# Patient Record
Sex: Male | Born: 1945 | Race: White | Hispanic: No | Smoking: Current every day smoker
Health system: Southern US, Community
[De-identification: ages and names within clinical notes are randomized; demographics above are authoritative.]

## PROBLEM LIST (undated history)

## (undated) DIAGNOSIS — E7800 Pure hypercholesterolemia, unspecified: Secondary | ICD-10-CM

## (undated) DIAGNOSIS — E271 Primary adrenocortical insufficiency: Secondary | ICD-10-CM

## (undated) DIAGNOSIS — I219 Acute myocardial infarction, unspecified: Secondary | ICD-10-CM

## (undated) DIAGNOSIS — I10 Essential (primary) hypertension: Secondary | ICD-10-CM

## (undated) DIAGNOSIS — K219 Gastro-esophageal reflux disease without esophagitis: Secondary | ICD-10-CM

## (undated) DIAGNOSIS — I2510 Atherosclerotic heart disease of native coronary artery without angina pectoris: Secondary | ICD-10-CM

## (undated) DIAGNOSIS — M549 Dorsalgia, unspecified: Secondary | ICD-10-CM

## (undated) DIAGNOSIS — R0600 Dyspnea, unspecified: Secondary | ICD-10-CM

## (undated) DIAGNOSIS — G8929 Other chronic pain: Secondary | ICD-10-CM

## (undated) HISTORY — PX: APPENDECTOMY: SHX54

## (undated) HISTORY — PX: BACK SURGERY: SHX140

## (undated) HISTORY — PX: CHOLECYSTECTOMY: SHX55

---

## 1999-04-19 DIAGNOSIS — I219 Acute myocardial infarction, unspecified: Secondary | ICD-10-CM

## 1999-04-19 HISTORY — DX: Acute myocardial infarction, unspecified: I21.9

## 1999-04-19 HISTORY — PX: CORONARY ANGIOPLASTY WITH STENT PLACEMENT: SHX49

## 2001-01-20 ENCOUNTER — Encounter: Payer: Self-pay | Admitting: Cardiology

## 2001-01-20 ENCOUNTER — Inpatient Hospital Stay (HOSPITAL_COMMUNITY): Admission: EM | Admit: 2001-01-20 | Discharge: 2001-01-21 | Payer: Self-pay | Admitting: Cardiology

## 2001-01-21 ENCOUNTER — Encounter: Payer: Self-pay | Admitting: Cardiology

## 2003-03-18 ENCOUNTER — Ambulatory Visit (HOSPITAL_COMMUNITY): Admission: RE | Admit: 2003-03-18 | Discharge: 2003-03-18 | Payer: Self-pay | Admitting: *Deleted

## 2003-04-19 HISTORY — PX: CORONARY ARTERY BYPASS GRAFT: SHX141

## 2003-04-22 ENCOUNTER — Inpatient Hospital Stay (HOSPITAL_COMMUNITY): Admission: RE | Admit: 2003-04-22 | Discharge: 2003-05-01 | Payer: Self-pay | Admitting: Cardiothoracic Surgery

## 2004-08-02 ENCOUNTER — Ambulatory Visit: Payer: Self-pay | Admitting: Cardiovascular Disease

## 2004-08-12 ENCOUNTER — Ambulatory Visit: Payer: Self-pay | Admitting: Cardiovascular Disease

## 2005-03-09 ENCOUNTER — Ambulatory Visit: Payer: Self-pay | Admitting: Cardiovascular Disease

## 2005-11-17 ENCOUNTER — Ambulatory Visit: Payer: Self-pay | Admitting: Cardiovascular Disease

## 2007-03-22 ENCOUNTER — Ambulatory Visit: Payer: Self-pay | Admitting: Cardiovascular Disease

## 2007-03-22 LAB — CONVERTED CEMR LAB
Bilirubin, Direct: 0.1 mg/dL (ref 0.0–0.3)
Cholesterol: 182 mg/dL (ref 0–200)
Cortisol, Plasma: 17.3 ug/dL
Eosinophils Absolute: 0 10*3/uL (ref 0.0–0.6)
Eosinophils Relative: 0.1 % (ref 0.0–5.0)
Free T4: 0.5 ng/dL — ABNORMAL LOW (ref 0.6–1.6)
GFR calc Af Amer: 88 mL/min
GFR calc non Af Amer: 73 mL/min
Glucose, Bld: 93 mg/dL (ref 70–99)
HCT: 37 % — ABNORMAL LOW (ref 39.0–52.0)
HDL: 33.7 mg/dL — ABNORMAL LOW (ref >39.0)
Lymphocytes Relative: 31.3 % (ref 12.0–46.0)
MCHC: 35.2 g/dL (ref 30.0–36.0)
MCV: 91.6 fL (ref 78.0–100.0)
Neutro Abs: 3.3 10*3/uL (ref 1.4–7.7)
Neutrophils Relative %: 62.4 % (ref 43.0–77.0)
PSA: 0.1 ng/mL (ref 0.10–4.00)
Potassium: 3.7 meq/L (ref 3.5–5.1)
Sodium: 139 meq/L (ref 135–145)
Total CHOL/HDL Ratio: 5.4
WBC: 5.2 10*3/uL (ref 4.5–10.5)
aPTT: 24.6 s (ref 21.7–29.8)

## 2007-05-15 ENCOUNTER — Ambulatory Visit: Payer: Self-pay | Admitting: Cardiovascular Disease

## 2007-05-15 LAB — CONVERTED CEMR LAB
ALT: 24 units/L (ref 0–53)
Bilirubin, Direct: 0.1 mg/dL (ref 0.0–0.3)
CO2: 29 meq/L (ref 19–32)
Calcium: 9.3 mg/dL (ref 8.4–10.5)
GFR calc Af Amer: 80 mL/min
Glucose, Bld: 84 mg/dL (ref 70–99)
Total Protein: 7.1 g/dL (ref 6.0–8.3)

## 2008-03-18 ENCOUNTER — Emergency Department (HOSPITAL_COMMUNITY): Admission: EM | Admit: 2008-03-18 | Discharge: 2008-03-18 | Payer: Self-pay | Admitting: Emergency Medicine

## 2008-03-28 ENCOUNTER — Ambulatory Visit (HOSPITAL_COMMUNITY): Admission: RE | Admit: 2008-03-28 | Discharge: 2008-03-28 | Payer: Self-pay | Admitting: Pulmonary Disease

## 2008-04-23 ENCOUNTER — Encounter: Admission: RE | Admit: 2008-04-23 | Discharge: 2008-04-23 | Payer: Self-pay | Admitting: Neurosurgery

## 2008-05-06 ENCOUNTER — Ambulatory Visit: Payer: Self-pay | Admitting: Cardiovascular Disease

## 2008-05-07 ENCOUNTER — Encounter: Admission: RE | Admit: 2008-05-07 | Discharge: 2008-05-07 | Payer: Self-pay | Admitting: Neurosurgery

## 2008-07-31 ENCOUNTER — Telehealth: Payer: Self-pay | Admitting: Cardiovascular Disease

## 2008-08-02 DIAGNOSIS — R0602 Shortness of breath: Secondary | ICD-10-CM | POA: Insufficient documentation

## 2008-08-02 DIAGNOSIS — K219 Gastro-esophageal reflux disease without esophagitis: Secondary | ICD-10-CM | POA: Insufficient documentation

## 2008-08-02 DIAGNOSIS — I10 Essential (primary) hypertension: Secondary | ICD-10-CM | POA: Insufficient documentation

## 2008-08-02 DIAGNOSIS — I498 Other specified cardiac arrhythmias: Secondary | ICD-10-CM | POA: Insufficient documentation

## 2008-08-02 DIAGNOSIS — I2510 Atherosclerotic heart disease of native coronary artery without angina pectoris: Secondary | ICD-10-CM | POA: Insufficient documentation

## 2008-08-02 DIAGNOSIS — E7800 Pure hypercholesterolemia, unspecified: Secondary | ICD-10-CM | POA: Insufficient documentation

## 2008-08-02 DIAGNOSIS — M549 Dorsalgia, unspecified: Secondary | ICD-10-CM | POA: Insufficient documentation

## 2008-08-05 ENCOUNTER — Encounter: Payer: Self-pay | Admitting: Cardiovascular Disease

## 2008-08-05 ENCOUNTER — Ambulatory Visit: Payer: Self-pay | Admitting: Cardiovascular Disease

## 2010-08-31 NOTE — Assessment & Plan Note (Signed)
[facility]                            CARDIOLOGY OFFICE NOTE   Victor Nelson, Victor Nelson                       MRN:          [id number]  DATE:05/06/2008                            DOB:          06-09-1945    Victor Nelson is here in today for a followup.  He has Addison disease.  He has  had a multitude of noncardiac issues without recently.   He has had significant back pain and saw Dr. Jeral Fruit.  He got some  injections.  He got a bit disoriented and confused on Percocet and  Xanax.   Dr. Juanetta Gosling sometimes takes care of his pain medicines.   He has also had another bout of diarrhea last year.  He had Norovirus  and had significant rhabdomyolysis with diarrhea, given his Addison  disease.  This is a significant problem.   He has not had any significant fevers or lightheadedness.   From a cardiac perspective, he has coronary artery disease with a  previous CABG.  He is not having any significant chest pain, PND, or  orthopnea.  There has been no palpitations or syncope.   His last Myoview study was a long time ago.  I believe back in 2004 or  2005.  He is otherwise doing well.   REVIEW OF SYSTEMS:  Otherwise negative.   MEDICATIONS:  1. Allegra.  2. Florinef 0.15 mg a day.  3. Aspirin a day.  4. Hydrocortisone 20 b.i.d.  5. Lopressor 25 a day.  6. Some sort of new stomach medicine.   PHYSICAL EXAMINATION:  VITAL SIGNS:  His weight is 254, blood pressure  is 140/80, pulse 53 and regular, respiratory rate 14, and afebrile.  HEENT:  Unremarkable.  NECK:  Carotids are normal without bruit.  No lymphadenopathy,  thyromegaly, or JVP elevation.  LUNGS:  Clear.  Good diaphragmatic motion.  No wheezing.  HEART:  S1 and S2.  Normal heart sounds.  PMI normal.  He has an  inverted nipple on the left.  ABDOMEN:  Benign.  Bowel sounds positive.  No AAA.  No tenderness.  No  bruit.  No hepatosplenomegaly.  No hepatojugular reflux.  EXTREMITIES:  Distal pulses are  intact.  No edema.  NEUROLOGIC:  Nonfocal.  SKIN:  Warm and dry.  MUSCULOSKELETAL:  No muscular weakness.  Status post radial harvest site  in the left arm.   IMPRESSION:  1. Coronary artery disease, previous coronary artery bypass graft.      Continue aspirin and low-dose beta-blocker.  2. Relative bradycardia.  Particularly given his Addison disease, I      would like his resting heart rate to be in the 60s.  We will cut      his Lopressor back to 12.5 mg a day.  3. Addison disease.  Continue Florinef and hydrocortisone replacement.      I believe his baseline cortisol level was recently checked by Dr.      Juanetta Gosling.  Consider increasing dosages during illnesses.  4. Back pain.  Follow up with Dr. Jeral Fruit.  Try to find non-sedating  anti-inflammatories such as meloxicam or Toradol.  5. Hypercholesterolemia.  The patient is currently not on statin      therapy.  He had rhabdomyolysis last year, although I do not think      he was primarily related to the statin.  I will leave it up to Dr.      Juanetta Gosling to see if he wants to reinstitute cholesterol therapy for      the patient.   The patient's EKG today showed sinus bradycardia at the rate of 53 with  low voltage and poor R-wave progression,     Peter C. Eden Emms, MD, [facility]  Electronically Signed    PCN/MedQ  DD: 05/06/2008  DT: 05/06/2008  Job #: [id number]

## 2010-08-31 NOTE — Assessment & Plan Note (Signed)
[facility]                            CARDIOLOGY OFFICE NOTE   OLDEN, KLAUER                       MRN:          [id number]  DATE:03/22/2007                            DOB:          1945/10/23    Mr. Victor Nelson returns today for followup.  He is doing well.   He has denied any significant chest pain.   Unfortunately, he has gained quite a bit of weight.  We had a bit of a  discussion about this.  He understands how bad it is for his health.  I  think it is also detrimental to his Addison's disease.  He needs to  follow this up with Dr. Juanetta Gosling.   Apparently, he needs some sort of a plant physical in regards to his  insurance rate.  He works for a [employer].  I told him I would be  happy to give him a copy of this note and do his blood work today.   In talking to him, he has not been very active.  He seems to worry a lot  about his bills and his job.  Sometimes people get on his nerves.  This,  coupled with somewhat of a poor diet, has caused him to gain quite a bit  of weight.  As far as I can tell, it has been at least 15 pounds since I  last saw him.   He does get occasional exertional dyspnea.  There has been no cough,  fever or sputum production.   REVIEW OF SYSTEMS:  Remarkable for improvement in his lower extremity  edema since I last saw him.  He did take my recommendation to have his  lower extremity varicosities sclerosed by Dr. Sondra Come.  Review of systems  otherwise negative.   CURRENT MEDICATIONS INCLUDE:  1. Ranitidine 150 b.i.d.  2. Lipitor 40 at night.  3. Allegra 180 a day.  4. Florinef 0.1 mg a day.  5. An aspirin a day.  6. Hydrocortisone 20 b.i.d.  7. Lopressor 25 a day.   He is taking a baby aspirin a day.   He denies any allergies.   EXAM:  His exam is remarkable for a jovial, but overweight, middle-aged  white male, in no distress.  Weight is 270, blood pressure 130/70, pulse 70 and regular.  There is an  occasional PVC on his EKG.  Respiratory rate 14, afebrile.  HEENT:  Unremarkable.  Carotids are without bruit.  No lymphadenopathy,  thyromegaly, or JVP elevation.  LUNGS:  Clear, good diaphragmatic motion, no wheezing.  His eyes are somewhat prominent.  There is an S1, S2, normal heart sounds.  PMI is normal.  He is status  post sternotomy.  ABDOMEN:  Protuberant.  Bowel sounds are positive.  No tenderness, no  bruit, no AAA, no hepatosplenomegaly or hepatojugular reflux.  Distal pulses are intact.  His varicosities are now gone.  He has tiny  spider vessels in each leg.  No edema.  NEUROLOGIC:  Nonfocal.  No muscular weakness.  SKIN:  Warm and dry.   IMPRESSION:  1. Stable, status  post CABG.  Continue aspirin and beta blocker.      Followup Myoview in two years.  2. Addison's disease.  Follow up with Dr. Juanetta Gosling.  If his weight      continues to increase, he may very well need a higher dose of      hydrocortisone.  He has not had any postural symptoms and I think      his Florinef dose is fine.  We will check his sodium and potassium      today, as well as a random cortisol.  3. Hypertension.  Currently stable.  Continue beta blocker.  He is not      on too much Florinef, as there is no systolic hypertension.  4. Hypercholesterolemia in the setting of previous CABG.  Continue      Lipitor 40 a day.  Liver and lipid profile to be done today.  5. 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      Continue activity levels and try to lose weight.  Continue      Ranitidine 150 b.i.d.  Overall, Victor Nelson is doing well outside of his      weight-gain.  He will follow with Dr. Juanetta Gosling for his Addison's      disease.  We will check his blood work, including a PSA, TSH, T4,      cortisol level, comprehensive metabolic profile, PT, PTT and lipid      panel today.  His EKG today showed sinus rhythm with nonspecific      STT-wave changes and occasional PVC.  His QT interval was  450.     Noralyn Pick. Eden Emms, MD, [facility]  Electronically Signed    PCN/MedQ  DD: 03/22/2007  DT: 03/22/2007  Job #: [id number]

## 2010-08-31 NOTE — Assessment & Plan Note (Signed)
[facility]                            CARDIOLOGY OFFICE NOTE   Victor, Scicchitano MALEAK Nelson                       MRN:          [id number]  DATE:05/15/2007                            DOB:          [birth year]    Victor Nelson returns today for follow-up.   The patient is status post CABG in 2005.   He has good LV function.   Unfortunately, he also has had Addison disease since about the age of  65.  He was recently hospitalized.  He had Norovirus and after only 3  days of nausea and vomiting presented with what sounds like  rhabdomyolysis with severe dehydration.  I suspect his hydrocortisone  taken orally was not being absorbed.   His Lipitor was stopped at the time.  I do not think it has anything to  do with his elevated CPK.  I think he had more of an addisonian crisis  with rhabdomyolysis.  He has been on Lipitor for awhile.  He had normal  LFTs twice last year, the most recent of which was December 2008.  He  has never had myalgias on the Lipitor.   I told him we would check his LFTs, CPK and BMET today.  If his indices  are back down to normal, we will resume his Lipitor.   Otherwise, he is doing well.   REVIEW OF SYSTEMS:  Remarkable for no recurrent nausea, vomiting or  diarrhea and no fevers.  He is back to his normal self.  He has actually  lost about 7 pounds.   MEDICATIONS:  1. Ranitidine 150 b.i.d.  2. Allegra 180 mg a day.  3. Florinef 0.1 mg a day.  4. An aspirin a day.  5. Hydrocortisone 20 mg b.i.d.  6. Lopressor 25 mg a day.  7. Probably Lipitor 40 mg a day to be resumed.   EXAM:  Remarkable for a healthy-appearing middle-aged white male in no  distress currently.  Weight is 263, down from 270.  Blood pressure is 116/71.  He is not  postural.  Pulse 57.  Afebrile.  HEENT:  Unremarkable.  Carotids were normal without bruit.  No lymphadenopathy, thyromegaly or  JVP elevation.  LUNGS:  Clear.  Good diaphragmatic motion.  No  wheezing.  S1, S2 with normal heart sounds, status post sternotomy.  PMI normal.  ABDOMEN:  Benign.  Bowel sounds positive.  No AAA.  No  hepatosplenomegaly or hepatojugular reflux.  No tenderness, no bruit.  Distal pulses are intact, no edema.  NEUROLOGIC:  Nonfocal.  SKIN:  Warm and dry.  No muscular weakness.   IMPRESSION:  1. Previous coronary artery bypass graft.  Continue aspirin and beta      blocker.  2. Hypercholesterolemia in the setting of coronary disease.  Checking      lab work to make sure rhabdomyolysis has resolved.  Restart Lipitor      if so.  3. Recent hospitalization for dehydration, rhabdomyolysis and      Norovirus.  His gastrointestinal tract seems to be back in order.      He is taking  p.o. well.  I had a long discussion with Victor Nelson and      told him that given his Addison disease, he really needs to see a      doctor if he goes more than 2 days without being able to take oral      intake or absorbing his hydrocortisone.  He follows up with Dr.      Juanetta Gosling for adjustments of his Florinef and hydrocortisone.  4. Addison disease, to be followed by Dr. Juanetta Gosling.  5. History of reflux.  Continue ranitidine 150 mg b.i.d.   I will see Victor Nelson back in 6 months and we will review his lab work when  it is done.     Noralyn Pick. Eden Emms, MD, [facility]  Electronically Signed    PCN/MedQ  DD: 05/15/2007  DT: 05/16/2007  Job #: [id number]

## 2010-09-03 NOTE — Discharge Summary (Signed)
NAME:  Victor Nelson, DEGRAFFENREID NO.:  [id number]   MEDICAL RECORD NO.:  [id number]                   PATIENT TYPE:  INP   LOCATION:  2025                                 FACILITY:  [facility]   PHYSICIAN:  Gwenith Daily. Tyrone Sage, M.D.            DATE OF BIRTH:  [birth year]   DATE OF ADMISSION:  04/22/2003  DATE OF DISCHARGE:  05/01/2003                                 DISCHARGE SUMMARY   ADMISSION DIAGNOSIS:  Critical coronary artery disease.   DISCHARGE/SECONDARY DIAGNOSES:  1. Critical coronary artery disease, status post stent in 2001 with history     of myocardial infarction in 2001.  Status post coronary artery bypass     grafting.  2. Primary adrenal insufficiency.  Diagnosed with Addison's disease at age     70.  3. Hyperlipidemia.  4. Postoperative hyponatremia, hypokalemia and orthostatic hypotension,     stable at discharge.  5. Postoperative atrial fibrillation, resolved on amiodarone and Lopressor.  6. Questionable right hand cellulitis versus phlebitis, status post bedside     incision and drainage.  Treated with oral Keflex.  Final cultures showed     no organisms or yeast or fungal elements.   CONSULTS:  Dr. Fayrene Fearing L. Deterding.   CARDIOLOGIST:  Dr. Charlton Haws.   PRIMARY CARE PHYSICIAN:  Dr. Ramon Dredge L. Hawkins.   PROCEDURES:  On April 22, 2003, Victor Nelson underwent coronary artery bypass  grafting x3 with the left internal mammary artery to the left anterior  descending coronary artery, left radial artery to the posterior descending  coronary artery and reversed saphenous vein graft to the distal circumflex.  He also underwent right thigh endovein and left radial artery harvesting.  Surgeon was Dr. Ramon Dredge B. Gerhardt.   BRIEF HISTORY:  Victor Nelson is a 65 year old male who was referred to Dr.  Ramon Dredge B. Gerhardt by Dr. Charlton Haws after experiencing a 2- to 63-month  history of increasing fatigue/exhaustion, increasing shortness of breath  with  exertion with vague chest discomfort.  Initially, this began once or  twice a month but had been increasing in frequency and came on quickly and  with less exertion more recently.  The episodes were relieved with rest or  with 1 nitroglycerin tablet.  The patient also has a history of myocardial  infarction in the past and had a stent placed in his LAD at [facility]  in 2001.  Because of his increasing symptoms, Dr. Charlton Haws performed a  Cardiolite stress test on March 05, 2004 which showed a moderate-sized  apical and inferoapical wall infarction but with significant interior and  anterior wall septal wall ischemia from the apex to the base with overall  ejection fraction of 49%.  Because of increasing symptoms and his positive  stress test, it was felt the patient would benefit from a repeat cardiac  catheterization.  The patient did undergo cardiac catheterization on  March 18, 2003 by Dr. Emilie Rutter. Pulsipher.  Findings demonstrated  reocclusion of his LAD with collateral filling from his right coronary  artery which had significant mid-vessel disease.  In addition, he had distal  disease in his circumflex.  Based on these findings, it was recommended that  he should undergo cardiac revascularization.  Victor Nelson initially saw Dr.  Tyrone Sage at the CVTS office on April 10, 2003.  After reviewing the  patient's films and discussing his symptoms and the findings of a Cardiolite  stress test, Dr. Tyrone Sage did agree with proceeding with coronary artery  bypass grafting.  He did discuss the risks, benefits and alternatives with  the patient and the patient did agree to proceed.  His surgery was  tentatively scheduled for April 22, 2003 at [facility].   HOSPITAL COURSE:  On April 22, 2003, Victor Nelson was electively admitted to  [facility] and did undergo coronary artery bypass grafting as  discussed above.  The patient tolerated the procedure well and was   transferred from the operating room to the surgical intensive care unit in  stable condition.  Later that evening, he was afebrile and hemodynamically  stable.  His chest tube output was minimal.  His postoperative labs were  also stable, however, his blood glucose was slightly elevated at 183 and he  did require sliding-scale insulin coverage.  This was felt to be secondary  to steroids for Addison's disease.   On postoperative day 1, [name] remained stable.  His blood pressure was  110/50, off dopamine.  He was started on Imdur secondary to his radial  artery harvesting.  By this time, he had been extubated and was  neurologically intact.  He was saturating 95% on room air.  His urine output  was also stable.   On postoperative day 2, [name] continued to progress.  He initially was  in sinus rhythm, however, later that evening, he did develop rapid atrial  fibrillation with a rate up to 200.  He was started on an amiodarone drip  and by the following morning, he had converted to sinus rhythm and was  changed over to oral amiodarone.  However, by postoperative day 4, he  developed rapid atrial fibrillation again and was restarted on IV  amiodarone.  He was also started on Lovenox secondary to his recurrent  atrial fibrillation.  By postoperative day 5, he had converted back to sinus  rhythm.  At this time, however, he was also having orthostatic blood  pressures and his labs showed a decreased sodium at 128 and relatively low  potassium at 3.9.  Based on these findings,  Dr. Fayrene Fearing L. Deterding was  consulted, who felt these findings were most consistent with relative  adrenal insufficiency, primarily glucocorticoid secondary to stress and some  volume depletion.  He did recommend increasing his hydrocortisone and adding  IV fluids.  His recommendations were followed and prior to discharge, Mr. Dicenzo sodium, potassium and blood pressures all improved.  On  postoperative day 5,  the patient was also noted to have right hand swelling  with erythema; this was felt secondary to phlebitis from his IV site.  His  erythema and swelling eventually progressed and did require Keflex and a  bedside incision and drainage.  Routine cultures were negative, as discussed  above.   Over the next several days, Mr. Hardenbrook continued to progress.  He did have  another short run of atrial fibrillation and for a  short time, was started  on Coumadin, however, he was in sinus rhythm for the last few days of his  hospitalization.  Due to the fact that the patient remained in sinus rhythm  at discharge, Dr. Tyrone Sage felt that he did not need to send the patient  home on Coumadin.  He was continued on oral amiodarone.   By postoperative day 9, Dr. Ramon Dredge B. Tyrone Sage did feel that Mr. Boehning was  stable for discharge.  The patient had remained afebrile throughout his  hospitalization.  He was currently in sinus rhythm.  His blood pressure was  stable on Lopressor and Imdur.  He was saturating 95% on room air.  During  his hospitalization, he did have intermittent nausea which may have been  possibly related to his amiodarone, however, this did resolve and prior to  his discharge, his bowel and bladder function were working appropriately.  His abdominal exam was benign.  His extremities showed no edema, other than  his right hand, which was discussed above.  This did show improvement on  Keflex.  His sternal and lower extremity incisions were clean, dry and  intact without signs of infection.  He was ambulating independently with a  steady gait.  Day before his discharge, his chest tubes had been removed  without complication.  His most recently chest x-ray on April 28, 2003  showed decreasing bibasilar atelectasis with persistent bilateral small  pleural effusions.  His most recent labs on May 01, 2003 showed a sodium  of 133, potassium 3.1, which was supplemented with oral potassium  prior to  his discharge home.  His blood glucose was 117, his BUN 8, his creatinine  1.1, his albumin 3.3, calcium 8.8 and phosphorus 3.9.  His urine osmolality  was normal at 437.  His most recent CBC on April 29, 2003 showed a normal  white blood count at 7.4, hemoglobin 9.7, hematocrit 27.5 and platelet count  330,000.   Due to his steady progression, Dr. Tyrone Sage did feel Mr. Shepherd would be  ready for discharge on May 01, 2003.   DISCHARGE INSTRUCTIONS:   DISCHARGE MEDICATIONS:  1. Coated aspirin 81 mg 1 p.o. daily.  2. Imdur 30 mg 1 p.o. daily.  3. Lopressor 25 mg t.i.d.  4. Amiodarone 400 mg twice a day x5 days, then decreased to daily.  5. Keflex 500 mg t.i.d. x6 days.  6. Zantac 150 mg 1 p.o. b.i.d.  7. Allegra 180 mg 1 p.o. daily.  8. Hydrocortisone 25 mg b.i.d. x10 days, then 20 mg twice a day.  9. Lipitor 40 mg 1 p.o. daily.  10.      Florinef 0.1 mg daily. 11.      Tylox 1-2 tablets p.o. q.4 h. p.r.n.   ACTIVITY:  Instructed to avoid driving, heavy lifting or strenuous activity.   DIET:  Needs to follow a low-fat, low-sodium diet.   WOUND CARE:  He may shower daily and clean his incisions gently with soap  and water.  He is to notify the CVTS office if he develops fever or redness  and drainage from his incision sites.   FOLLOWUP:  1. He is to follow up with Dr. Tyrone Sage on Thursday, May 22, 2003, at 11     a.m.  He is instructed to bring his most recent chest x-ray with him to     this appointment.  2. He is to follow up with Dr. Charlton Haws in 2 weeks with a chest x-ray.  An appointment was scheduled for him to see the physician assistant on     May 15, 2003 at 10:30 a.m.      Jerold Coombe, P.A.                  Gwenith Daily Tyrone Sage, M.D.    AWZ/MEDQ  D:  07/02/2003  T:  07/05/2003  Job:  [id number]   cc:   Gwenith Daily. Tyrone Sage, M.D.  [address]  [place]  [place] [address]   Charlton Haws, M.D.   Edward L. Juanetta Gosling, M.D.  [address]  [place]  [place] [address]  Fax: [phone]

## 2010-09-03 NOTE — Consult Note (Signed)
NAME:  Victor Nelson, Victor Nelson NO.:  [id number]   MEDICAL RECORD NO.:  [id number]                   PATIENT TYPE:  INP   LOCATION:  2025                                 FACILITY:  [facility]   PHYSICIAN:  Dmarcus L. Deterding, M.D.            DATE OF BIRTH:  1945/07/31   DATE OF CONSULTATION:  04/28/2003  DATE OF DISCHARGE:                                   CONSULTATION   REFERRING PHYSICIAN:  Gwenith Daily. Tyrone Sage, M.D.   REASON FOR CONSULTATION:  1. Hyponatremia.  2. Hypokalemia.  3. Orthostatic hypotension.   HISTORY OF PRESENT ILLNESS:  This is a 65 year old gentleman who has a  history of coronary artery disease, status post stent in 2001, with a  history of an MI at that time.  He underwent a coronary artery bypass  grafting on April 22, 2003, and is now six days postoperative.  He had had  recurrent angina, which was reason he was evaluated.  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 also and Florinef 0.1 mg a day.  He has a  brother who has Addison's and also was diagnosed in his teens.  He has a  history of hyperlipidemia.  His hydrocortisone was increased for two days  postoperatively, but then it was cut back to his usual dose of 20 mg b.i.d.  His weight has decreased from 254 to 239 pounds.  Postoperatively, he did  fairly well until the last three days when his serum sodium had decreased  from 135 to 126.  Serum potassium has been normal to mildly decreased and  now down to 3.2.  Also, he has developed some orthostatic hypotension.  He  also has had atrial fibrillation in the last four or five days.   PAST MEDICAL HISTORY:  He quit smoking about eight years ago.  He has a  history of mildly increased lipids.   MEDICATIONS:  Above.   ALLERGIES:  No known allergies.  He does have hay fever.   FAMILY HISTORY:  Positive for a stroke in his father who died at about 32.  His  father was also paralyzed.  His mother died of liver disease in her 34s.  A brother died at age 26 with myocardial infarction.   PAST MEDICAL HISTORY:  As listed above.  He has also had back surgery.  He  also had a colonoscopy.   SOCIAL HISTORY:  He is married.  He is employed in the [employer] as a  Pensions consultant.   MEDICATIONS AT HOME:  1. Aspirin 325 mg a day.  2. Allegra 180 mg a day.  3. Darvocet 100 mg q.6h. p.r.n.  4. Zantac 150 mg b.i.d. p.r.n.  5. Hydrocortisone 20 mg b.i.d.  6. Atenolol 50 mg.  7. Florinef 0.1 mg.  8. Lipitor 40 mg a day.  REVIEW OF SYSTEMS:  HEENT:  Denies visual difficulties, headaches, and  hearing difficulties.  PULMONARY:  No cough, sputum production, shortness of  breath, and asthma.  He does have hay fever.  MUSCULOSKELETAL:  Other than  back pain, he denies problems.  GASTROINTESTINAL:  He has occasional  constipation.  No nausea or vomiting.  He has had some trouble swallowing  pills the last couple of days.  GENITOURINARY:  He has nocturia x 1-2.  He  denies edema.  He says that he has had varicose veins in the past.  NEUROLOGIC:  He denies problems other than the fact that he is anxious.   OBJECTIVE:  GENERAL APPEARANCE:  A middle-aged gentleman who appears his  stated age.  He is in no acute distress.  VITAL SIGNS:  The blood pressure is 118/66, temperature 98.6 degrees, and  heart rate going from 66-84.  CARDIOVASCULAR:  Irregularly irregular.  A grade 2/6 holosystolic murmur at  the apex.  The PMI is __________ fifth intercostal space.  There is 1+  edema.  Pulses are 2+ and 4+.  No bruits are noted.  LUNGS:  Some crackles in both bases.  Normal breath sounds.  Normal  resonance to percussion.  Normal expansion.  ABDOMEN:  Positive bowel sounds.  Soft.  The liver is down 2-3 cm.  SKIN:  A few seborrheic keratoses and some skin tags.  MUSCULOSKELETAL:  Unremarkable.  He has an incision in his left forearm  where they  took his radial artery, as well as his midline CABG incision and  mediastinal incisions.   LABORATORY DATA:  The TSH is 3.8.  Hemoglobin 9.8, white count 5400,  platelets 290,000.  Sodium 126, potassium 3.2, chloride 91, bicarbonate 26,  creatinine 0.9, BUN 7, glucose 120, magnesium 2.3.   ASSESSMENT:  1. Decreased sodium, decreased serum potassium, and orthostasis most     consistent with relative adrenal insufficiency, primarily glucocorticoid     secondary to stress and some volume depletion.  The decreased potassium     is secondary to post diuretic effect, as well as the Florinef.  The     mineralocorticoid deficiency usually causes hyperkalemia.  I do not think     he is truly mineralocorticoid deficient.  He needs increase in doses of     his glucocorticoid and a stable dose of mineralocorticoid in this     setting.  He is unable to excrete free water with his relative volume     depletion which stimulates ADH, especially if free water is retaining     excess in the setting of decreased corticoids.  2. Status post coronary artery bypass graft and coronary artery disease.  3. Atrial fibrillation.  Plan per CVTS and cardiology.  4. History of lumbar disk surgery.   PLAN:  1. Increase his hydrocortisone.  2. Check a urine sodium, creatinine, and osmolality.  3. IV fluids.                                               Laney L. Deterding, M.D.    JLD/MEDQ  D:  04/28/2003  T:  04/28/2003  Job:  [id number]

## 2010-09-03 NOTE — Cardiovascular Report (Signed)
NAME:  Victor Nelson, Victor Nelson NO.:  [id number]   MEDICAL RECORD NO.:  [id number]                   PATIENT TYPE:  OIB   LOCATION:  2858                                 FACILITY:  [facility]   PHYSICIAN:  Carole Binning, M.D. [facility]         DATE OF BIRTH:  Mar 11, 1946   DATE OF PROCEDURE:  03/18/2003  DATE OF DISCHARGE:                              CARDIAC CATHETERIZATION   PROCEDURE PERFORMED:  Left heart catheterization with coronary angiography  and left ventriculography.   INDICATIONS:  Mr. Bosler is a 65 year old male with a history of prior  percutaneous coronary intervention with stent placement in the LAD.  He  initially had an anterior wall myocardial infarction in January of 2001  following which he had a repeat angioplasty and stenting of the LAD.  He was  seen in the office by Charlton Haws, M.D. and was having symptoms of  exertional dyspnea and intermittent chest pain.  A stress Cardiolite scan  showed significant anterior and anteroseptal wall ischemia with moderate  sized apical and inferoapical wall infarction, ejection fraction 49%.  He  was thus referred for cardiac catheterization.   PROCEDURAL NOTE:  A 6-French sheath was placed in the right femoral artery.  Coronary angiography was performed with 6-French JL4 and JR4 catheters.  Left ventriculography was performed with an angled pigtail catheter.  Contrast was Omnipaque.  There were no complications.   RESULTS:  HEMODYNAMICS:  Left ventricular pressure 142/26.  Aortic pressure 142/74.  There is no  aortic valve gradient.   LEFT VENTRICULOGRAM:  There is mild akinesis of the inferior apical wall with moderate hypokinesis  of the anterior wall.  Ejection fraction estimated at 50%.  There is no  mitral regurgitation.   CORONARY ARTERIOGRAPHY:  (Codominant)  Left main is normal.   Left anterior descending artery is 100% occluded in the proximal vessel just  after the first septal  perforator.  Prior to the occlusion the LAD does give  rise to a small diagonal branch.  The mid and distal LAD fill via left to  left and left to right collaterals.  There is a stent in the mid LAD and the  collaterals fill retrograde into the very proximal margin of the stent.  However, there is an area of occlusion of the LAD between the proximal  vessel and the stent which appears to be approximately 20 mm in length.  The  LAD occlusion has a very abrupt cut off tapering into the septal perforator.  There is also a second diagonal branch which fills poorly via collaterals  which rises from within the occluded portion of the LAD.   Left circumflex is a codominant vessel.  It gives rise to a large obtuse  marginal branch, a large first posterolateral branch, and a normal sized  second posterolateral branch.  There is a 50% stenosis in the distal  circumflex prior to the first posterolateral branch  followed by two 50%  stenoses between the first and second posterolateral branches.  The first  posterolateral branch itself also has an area of ectasia in the proximal  segment proximal to a bifurcation.   Right coronary artery is a codominant vessel.  There is a 30% stenosis in  the proximal vessel, 30% in the mid vessel followed by an area of ectasia.  In the distal vessel at the acute margin there is a 70% stenosis.  The  distal right coronary artery gives rise to a normal sized posterior  descending artery.  The acute marginal branch and posterior descending  artery supply right to left collaterals to the mid and distal LAD.   IMPRESSION:  1. Mildly decreased left ventricular systolic function with wall motion     abnormality as described.  2. Three vessel coronary artery disease characterized by a chronic total     occlusion of the proximal left anterior descending which appears to be     approximately 20 mm in length.  This occurs proximal to a previously     placed stent in the mid  left anterior descending.  There is moderate     disease in the distal right coronary artery as well as mild to moderate     disease in the distal circumflex coronary artery.   PLAN:  These findings were reviewed with both Arturo Morton. Riley Kill, M.D. and  Charlton Haws, M.D.  The nature of the occlusion of the LAD renders it not  favorable for percutaneous intervention due to the septal perforator side  branch arising from the LAD at the site of the occlusion as well as the  length of the occlusion.  In addition, there appears to be diffuse  restenosis within the stented segment of the LAD as well.  Plan at this  point is to hold further discussions with the patient.  Options include  coronary artery bypass surgery versus possible attempts at percutaneous  intervention via LAD and consideration of use of the [employer] chronic  total occlusion coronary guidewire.                                               Carole Binning, M.D. [facility]    MWP/MEDQ  D:  03/18/2003  T:  03/18/2003  Job:  [id number]   cc:   Ramon Dredge L. Juanetta Gosling, M.D.  [address]  [place]  [place] [address]  Fax: [phone]   Charlton Haws, M.D.   Cath Lab

## 2010-09-03 NOTE — Assessment & Plan Note (Signed)
[facility]                              CARDIOLOGY OFFICE NOTE   KAREEN, HITSMAN                       MRN:          [id number]  DATE:11/17/2005                            DOB:          Dec 17, 1945    Mr. Victor Nelson returns today for followup.  He is status post CABG 2 years ago.  He has Addison's disease.  He has been having increasing night sweats.  I  told him to talk to Dr. Juanetta Gosling regarding his current dose of Florinef and  hydrocortisone.  He has significant lower extremity varicosities and is  finally seeing Dr. Sondra Come to have them sclerosed.  He actually has a small  area of pre-ulceration in the left medial malleolus.   From a cardiac perspective, he is stable.  He is not having significant  chest pain.  His risk factors are well modified.   PHYSICAL EXAMINATION:  VITAL SIGNS:  He continues to be somewhat overweight.  Blood pressure is 128/70, pulse 70 and regular.  LUNGS:  Clear.  NECK:  Carotids are normal.  HEART:  There is an S1 and S2.  CHEST:  A well-healed sternotomy.  ABDOMEN:  Benign.  LOWER EXTREMITIES:  Intact pulses.  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   IMPRESSION:  Stable status post CABG.  Good risk factor modification.  No  evidence of chest pain.  He will continue with statin drug, beta blocker and  aspirin.   I will see him back in a year.  He will followup with Dr. Sondra Come to get his  left lower extremity varicosities sclerosed, and he will see Dr. Juanetta Gosling  regarding possibly increasing his Florinef and hydrocortisone dose for  Addison's disease.                               Noralyn Pick. Eden Emms, MD, [facility]    PCN/MedQ  DD:  11/17/2005  DT:  11/17/2005  Job #:  [id number]

## 2010-09-03 NOTE — Discharge Summary (Signed)
[place]  Patient:    Victor Nelson, Victor Nelson Visit Number: [id number] MRN: [id number]          Service Type: MED Location: [facility] [id number] Attending Physician:  Ronaldo Miyamoto Dictated by:   Delton See, P.A. Admit Date:  01/20/2001 Discharge Date: 01/21/2001   CC:         Wilhemina Bonito. Eda Keys., M.D. [facility]  Kari Baars, M.D.  Noralyn Pick. Eden Emms, M.D. [facility]   Referring Physician Discharge [place]  DATE OF BIRTH:  2045/08/20  BRIEF HISTORY:  This is a 65 year old male who was transferred to [facility]. [facility] from the [facility] Emergency Room where the patient presented with abdominal pain.  He apparently has a history of coronary artery disease and is status post PTCA stenting in January of 2001. The details are not available at this time.  He has a history of Addisons disease and a history of tobacco use.  As noted, the patient presented to [facility] with abdominal pain.  He was felt to have possible mesenteric ischemia.  He was noted to have elevated CK-MB enzymes.  Arturo Morton. Riley Kill, M.D., was contacted and agreed to accept the patient in transfer.  PAST MEDICAL HISTORY:  Significant for coronary artery disease, status post PTCA stenting at [facility] in January of 2001.  He had an anterior subendocardial MI in February of 2002.  He has a history of Addisons disease diagnosed at age 64.  History of tobacco use.  History of low back pain.  ALLERGIES:  No known drug allergies.  SOCIAL HISTORY:  The patient works at a [employer].  He has a previous history of alcohol and a history of tobacco use.   He is married.  [place]  HOSPITAL COURSE:  As noted, this patient was transferred to [facility] for further evaluation of abdominal pain and elevated CK-MB enzymes.  Troponin enzymes were negative.  A GI consult was obtained after admission.  There was a  question of mesenteric ischemia, however, it was eventually felt that the patient was suffering from diverticulitis.  He was started on Cipro.  He showed improvement and he was discharged the following day, January 21, 2001, in stable condition.  LABORATORY DATA:  As noted, CK-MB enzymes were mildly elevated.  Troponin enzymes were negative.  A basic metabolic panel at [facility] was within normal limits.  A CBC was within normal limits.  An EKG showed normal sinus rhythm and rate 61 beats per minute with an old inferior infarct.  DISCHARGE MEDICATIONS: 1. Cipro 500 mg b.i.d. for 10 days. 2. Darvocet-N 100 one q.6h. p.r.n. abdominal pain. 3. Florinef 0.1 mg daily. 4. Hydrocortisone 25 mg b.i.d. 5. Aspirin 325 mg daily. 6. Atenolol as previously taken. 7. Lipitor as previously taken. 8. Nitroglycerin as needed for chest pain.  ACTIVITY:  The patient was told to avoid any strenuous activity for at least one week.  DIET:  He was to be on a full liquid diet on the day of discharge and gradually advance to a low-residue diet if feeling better.  FOLLOW-UP:  He was told to call on Monday for an appointment with Theron Arista C. Eden Emms, M.D.  He was told to call on Monday for a follow-up appointment with Wilhemina Bonito. Eda Keys., M.D.  It was felt that he would probably need an outpatient colonoscopy.  He was also to follow up with Kari Baars, M.D.,  as needed.  PROBLEM LIST AT THE TIME OF DISCHARGE: 1. Abdominal pain felt to be secondary to diverticulitis.  Treated with    antibiotics. 2. History of coronary artery disease as described above. 3. History of Addisons disease. 4. History of tobacco use. 5. History of back pain. 6. History of elevated lipids.  ADDENDUM:  The patient had previously been on lisinopril.  This was apparently discontinued in February secondary to lightheadedness. Dictated by:   Delton See, P.A. Attending Physician:  Ronaldo Miyamoto DD:   01/21/01 TD:  01/21/01 Job: [id number]

## 2010-09-03 NOTE — Op Note (Signed)
NAME:  Victor Nelson, Victor Nelson NO.:  [id number]   MEDICAL RECORD NO.:  [id number]                   PATIENT TYPE:  INP   LOCATION:  2025                                 FACILITY:  [facility]   PHYSICIAN:  Gwenith Daily. Tyrone Sage, M.D.            DATE OF BIRTH:  [birth year]   DATE OF PROCEDURE:  04/22/2003  DATE OF DISCHARGE:                                 OPERATIVE REPORT   PREOPERATIVE DIAGNOSIS:  Coronary occlusive disease.   POSTOPERATIVE DIAGNOSIS:  Coronary occlusive disease.   PROCEDURE:  Coronary artery bypass grafting x3 with the left internal  mammary to the left anterior descending coronary artery, left radial artery  to the posterior descending coronary artery and reverse saphenous vein graft  to the distal  circumflex. Right thigh endovein and left radial artery  harvest.   SURGEON:  Gwenith Daily. Tyrone Sage, M.D.   FIRST ASSISTANT:  Rowe Clack, P.A.-C.   INDICATIONS FOR PROCEDURE:  The patient is a 65 year old male with known  Addison's disease who had previously suffered an anterior myocardial  infarction where he was treated with angioplasty and stent at [facility]. Subsequently he has now returned with increasing  episodes of  chest pain. Cardiac catheterization was performed by  Dr. Eden Emms which  demonstrated reocclusion of his LAD with collateral filling from the right  coronary artery which had significant mid vessel disease. In addition, he  had distal disease in the circumflex. Because of the symptoms and Cardiolite  stress test, coronary artery bypass graft was recommended to the patient who  agreed and signed informed consent. Because of the patient's known  Addison's disease, appropriate levels of steroid supplementation was  provided intraoperatively.   DESCRIPTION OF PROCEDURE:  With Swann-Ganz and arterial line monitors in  place the patient underwent  general endotracheal anesthesia. The skin  of  the chest, legs and left arm were  all prepped with Betadine and draped in  the usual sterile manner. Preoperative vascular testing revealed that the  ulnar artery was a major supplier of the palmar arch which was intact.   After draping the patient attention was turned first to the radial artery  which with a curvilinear incision over the volar aspect of the left arm,  dissecting out the radial artery along with the paired veins. The small side  branches were divided with the harmonic scalpel. The distal  and the  proximal end of the radial artery were then ligated with silk sutures and  the radial artery was flushed with heparinized blood. It appeared  to be a  good vessel for bypass. The incision was then closed with running 2-0 Dexon  suture and a 4-0 subcuticular stitch in the skin edges.   Attention was turned harvesting the vein using a Guidant endovein harvesting  system and a right thigh segment of vein was harvested. It was of good  quality and caliber. A  median sternotomy was performed. The left internal  mammary artery was dissected down as a pedicle graft. The distal artery was  divided and had good free flow.   The pericardium was opened. Overall ventricular function appeared  preserved, although the patient did have significant biventricular  enlargement, making consideration for off the pump bypass difficult because  of the increased  size of the patient's heart. He was systemically  heparinized. The ascending aorta and the right atrium were cannulated.   A cardioplegia solution was introduced into the ascending aorta. The patient  was placed on cardiopulmonary bypass, 2.4 liters per meter squared. The  sites of anastomosis were selected and dissected out of the epicardium. The  patient's body temperature  was cooled to 30 degrees. The aortic cross clamp  was applied and 500 mL of cold blood potassium  cardioplegia administered  with rapid diastolic arrest of the heart. The myocardial septal temperature   was monitored after cross clamp.   Attention was turned first to the posterior descending coronary artery which  was opened and admitted a 1.5-mm probe. Using a running 8-0 Prolene, the  radial artery was harvested to the posterior descending coronary artery.  Attention was turned the distal  circumflex which had several small  branches. The largest of these branches was open and admitted a 1.5-mm probe  and using a running 7-0 Prolene, a distal  anastomosis was performed.   Attention was turned to the left anterior descending coronary which was  opened in the midportion. Using a running 8-0 Prolene, the left internal  mammary artery was anastomosed to the left anterior descending coronary  artery. With release of the Edwards bulldog on the mammary artery, there was  appropriate rise in the myocardial septal temperature.   The aortic  cross clamp  was removed. The total cross clamp time was 45  minutes. The patient required electrical  defibrillation to return to a  sinus rhythm. A partial occlusion clamp was placed on the ascending aorta  and 2 punch aortotomies were performed.   The radial artery was anastomosed to the ascending aorta with a running 7-0  Prolene. The vein was anastomosed with a running 6-0 Prolene. Air was  evacuated from the grafts and the partial occlusion clamp was removed. The  sites of anastomosis were inspected and were free of leak.   The patient was then ventilated and weaned from cardiopulmonary bypass  without difficulty.  He remained hemodynamically stable. He was decannulated  in the usual fashion and Protamine sulfate was administered. With the  operative field hemostatic, 2 atrial and 2 ventricular pacing wires were  applied. Graft  markers were applied. A left pleural tube and 2 mediastinal  tubes were  left in place.   The sternum was closed with #6 stainless steel wire. The fascia was closed with interrupted 0 Vicryl with running 3-0 Vicryl for the  subcutaneous  tissue and 4-0 subcuticular stitch in the skin edges. Dry dressings were  applied. All sponge, instrument and needle counts were reported as correct  at the end of the procedure.   The patient tolerated the procedure without obvious complications. He was  transferred to the surgical intensive care unit for further postoperative  care.  Gwenith Daily Tyrone Sage, M.D.    Tyson Babinski  D:  04/27/2003  T:  04/27/2003  Job:  [id number]   cc:   Charlton Haws, M.D.

## 2012-01-07 DIAGNOSIS — M549 Dorsalgia, unspecified: Secondary | ICD-10-CM

## 2014-07-19 ENCOUNTER — Emergency Department (HOSPITAL_COMMUNITY): Payer: Medicare Other

## 2014-07-19 ENCOUNTER — Encounter (HOSPITAL_COMMUNITY): Payer: Self-pay | Admitting: Emergency Medicine

## 2014-07-19 ENCOUNTER — Inpatient Hospital Stay (HOSPITAL_COMMUNITY)
Admission: EM | Admit: 2014-07-19 | Discharge: 2014-07-21 | DRG: 189 | Disposition: A | Discharge status: Home / Self Care | Payer: Medicare Other | Attending: Pulmonary Disease | Admitting: Pulmonary Disease

## 2014-07-19 DIAGNOSIS — R509 Fever, unspecified: Secondary | ICD-10-CM | POA: Diagnosis present

## 2014-07-19 DIAGNOSIS — I252 Old myocardial infarction: Secondary | ICD-10-CM | POA: Diagnosis not present

## 2014-07-19 DIAGNOSIS — Z955 Presence of coronary angioplasty implant and graft: Secondary | ICD-10-CM

## 2014-07-19 DIAGNOSIS — Z951 Presence of aortocoronary bypass graft: Secondary | ICD-10-CM

## 2014-07-19 DIAGNOSIS — J9801 Acute bronchospasm: Secondary | ICD-10-CM

## 2014-07-19 DIAGNOSIS — R400 Somnolence: Secondary | ICD-10-CM | POA: Diagnosis present

## 2014-07-19 DIAGNOSIS — I10 Essential (primary) hypertension: Secondary | ICD-10-CM | POA: Diagnosis present

## 2014-07-19 DIAGNOSIS — F17210 Nicotine dependence, cigarettes, uncomplicated: Secondary | ICD-10-CM | POA: Diagnosis present

## 2014-07-19 DIAGNOSIS — G8929 Other chronic pain: Secondary | ICD-10-CM | POA: Diagnosis present

## 2014-07-19 DIAGNOSIS — Z7952 Long term (current) use of systemic steroids: Secondary | ICD-10-CM

## 2014-07-19 DIAGNOSIS — J9601 Acute respiratory failure with hypoxia: Secondary | ICD-10-CM | POA: Diagnosis present

## 2014-07-19 DIAGNOSIS — E271 Primary adrenocortical insufficiency: Secondary | ICD-10-CM | POA: Diagnosis present

## 2014-07-19 DIAGNOSIS — I2510 Atherosclerotic heart disease of native coronary artery without angina pectoris: Secondary | ICD-10-CM | POA: Diagnosis present

## 2014-07-19 DIAGNOSIS — J209 Acute bronchitis, unspecified: Secondary | ICD-10-CM | POA: Diagnosis present

## 2014-07-19 DIAGNOSIS — E780 Pure hypercholesterolemia: Secondary | ICD-10-CM | POA: Diagnosis present

## 2014-07-19 DIAGNOSIS — R0602 Shortness of breath: Secondary | ICD-10-CM | POA: Diagnosis not present

## 2014-07-19 DIAGNOSIS — D899 Disorder involving the immune mechanism, unspecified: Secondary | ICD-10-CM | POA: Diagnosis present

## 2014-07-19 DIAGNOSIS — N289 Disorder of kidney and ureter, unspecified: Secondary | ICD-10-CM

## 2014-07-19 DIAGNOSIS — M549 Dorsalgia, unspecified: Secondary | ICD-10-CM | POA: Diagnosis present

## 2014-07-19 DIAGNOSIS — K219 Gastro-esophageal reflux disease without esophagitis: Secondary | ICD-10-CM | POA: Diagnosis present

## 2014-07-19 DIAGNOSIS — G4710 Hypersomnia, unspecified: Secondary | ICD-10-CM | POA: Diagnosis present

## 2014-07-19 HISTORY — DX: Dyspnea, unspecified: R06.00

## 2014-07-19 HISTORY — DX: Dorsalgia, unspecified: M54.9

## 2014-07-19 HISTORY — DX: Acute myocardial infarction, unspecified: I21.9

## 2014-07-19 HISTORY — DX: Gastro-esophageal reflux disease without esophagitis: K21.9

## 2014-07-19 HISTORY — DX: Essential (primary) hypertension: I10

## 2014-07-19 HISTORY — DX: Atherosclerotic heart disease of native coronary artery without angina pectoris: I25.10

## 2014-07-19 HISTORY — DX: Other chronic pain: G89.29

## 2014-07-19 HISTORY — DX: Pure hypercholesterolemia, unspecified: E78.00

## 2014-07-19 HISTORY — DX: Primary adrenocortical insufficiency: E27.1

## 2014-07-19 LAB — CBC
HCT: 37.5 % — ABNORMAL LOW (ref 39.0–52.0)
HEMOGLOBIN: 12.8 g/dL — AB (ref 13.0–17.0)
MCH: 29.6 pg (ref 26.0–34.0)
MCHC: 34.1 g/dL (ref 30.0–36.0)
MCV: 86.8 fL (ref 78.0–100.0)
PLATELETS: 138 10*3/uL — AB (ref 150–400)
RBC: 4.32 MIL/uL (ref 4.22–5.81)
RDW: 14.1 % (ref 11.5–15.5)
WBC: 7.6 10*3/uL (ref 4.0–10.5)

## 2014-07-19 LAB — COMPREHENSIVE METABOLIC PANEL
ALBUMIN: 4.1 g/dL (ref 3.5–5.2)
ALT: 42 U/L (ref 0–53)
AST: 39 U/L — AB (ref 0–37)
Alkaline Phosphatase: 66 U/L (ref 39–117)
Anion gap: 10 (ref 5–15)
BILIRUBIN TOTAL: 0.9 mg/dL (ref 0.3–1.2)
BUN: 20 mg/dL (ref 6–23)
CHLORIDE: 101 mmol/L (ref 96–112)
CO2: 21 mmol/L (ref 19–32)
Calcium: 8.3 mg/dL — ABNORMAL LOW (ref 8.4–10.5)
Creatinine, Ser: 1.39 mg/dL — ABNORMAL HIGH (ref 0.50–1.35)
GFR calc Af Amer: 59 mL/min — ABNORMAL LOW (ref >90)
GFR calc non Af Amer: 51 mL/min — ABNORMAL LOW (ref >90)
GLUCOSE: 117 mg/dL — AB (ref 70–99)
Potassium: 3.2 mmol/L — ABNORMAL LOW (ref 3.5–5.1)
Sodium: 132 mmol/L — ABNORMAL LOW (ref 135–145)
TOTAL PROTEIN: 6.9 g/dL (ref 6.0–8.3)

## 2014-07-19 LAB — URINALYSIS, ROUTINE W REFLEX MICROSCOPIC
Bilirubin Urine: NEGATIVE
GLUCOSE, UA: NEGATIVE mg/dL
Ketones, ur: NEGATIVE mg/dL
LEUKOCYTES UA: NEGATIVE
NITRITE: NEGATIVE
PH: 6.5 (ref 5.0–8.0)
Protein, ur: NEGATIVE mg/dL
Specific Gravity, Urine: 1.015 (ref 1.005–1.030)
Urobilinogen, UA: 0.2 mg/dL (ref 0.0–1.0)

## 2014-07-19 LAB — URINE MICROSCOPIC-ADD ON

## 2014-07-19 LAB — TROPONIN I: Troponin I: <0.03 ng/mL (ref <0.031)

## 2014-07-19 LAB — BLOOD GAS, ARTERIAL
Acid-base deficit: 4.2 mmol/L — ABNORMAL HIGH (ref 0.0–2.0)
Bicarbonate: 20.9 mEq/L (ref 20.0–24.0)
Drawn by: 27407
O2 Content: 2 L/min
O2 Saturation: 91.6 %
PCO2 ART: 41 mmHg (ref 35.0–45.0)
TCO2: 19.1 mmol/L (ref 0–100)
pH, Arterial: 7.328 — ABNORMAL LOW (ref 7.350–7.450)
pO2, Arterial: 67 mmHg — ABNORMAL LOW (ref 80.0–100.0)

## 2014-07-19 LAB — LACTIC ACID, PLASMA
LACTIC ACID, VENOUS: 1.2 mmol/L (ref 0.5–2.0)
Lactic Acid, Venous: 1.6 mmol/L (ref 0.5–2.0)

## 2014-07-19 LAB — INFLUENZA PANEL BY PCR (TYPE A & B)
H1N1 flu by pcr: NOT DETECTED
INFLAPCR: NEGATIVE
Influenza B By PCR: NEGATIVE

## 2014-07-19 MED ORDER — ONDANSETRON HCL 4 MG PO TABS: 4.0000 mg | ORAL_TABLET | Freq: Four times a day (QID) | ORAL | Status: DC | PRN

## 2014-07-19 MED ORDER — IPRATROPIUM BROMIDE 0.02 % IN SOLN: 0.5000 mg | Freq: Four times a day (QID) | RESPIRATORY_TRACT | Status: DC

## 2014-07-19 MED ORDER — SODIUM CHLORIDE 0.9 % IV SOLN
INTRAVENOUS | Status: AC
Start: 2014-07-19 — End: 2014-07-20

## 2014-07-19 MED ORDER — VANCOMYCIN HCL IN DEXTROSE 1-5 GM/200ML-% IV SOLN
INTRAVENOUS | Status: AC
  Filled 2014-07-19: qty 200

## 2014-07-19 MED ORDER — ACETAMINOPHEN 650 MG RE SUPP: 650.0000 mg | Freq: Four times a day (QID) | RECTAL | Status: DC | PRN

## 2014-07-19 MED ORDER — ALBUTEROL SULFATE (2.5 MG/3ML) 0.083% IN NEBU: 2.5000 mg | INHALATION_SOLUTION | Freq: Four times a day (QID) | RESPIRATORY_TRACT | Status: DC

## 2014-07-19 MED ORDER — ALUM & MAG HYDROXIDE-SIMETH 200-200-20 MG/5ML PO SUSP: 30.0000 mL | Freq: Four times a day (QID) | ORAL | Status: DC | PRN

## 2014-07-19 MED ORDER — OXYCODONE HCL 5 MG PO TABS
5.0000 mg | ORAL_TABLET | Freq: Four times a day (QID) | ORAL | Status: DC
  Administered 2014-07-20 – 2014-07-21 (×6): 5 mg via ORAL
  Filled 2014-07-19 (×6): qty 1

## 2014-07-19 MED ORDER — OXYCODONE-ACETAMINOPHEN 5-325 MG PO TABS
1.0000 | ORAL_TABLET | Freq: Four times a day (QID) | ORAL | Status: DC
  Administered 2014-07-20 – 2014-07-21 (×6): 1 via ORAL
  Filled 2014-07-19 (×6): qty 1

## 2014-07-19 MED ORDER — METHYLPREDNISOLONE SODIUM SUCC 125 MG IJ SOLR
60.0000 mg | Freq: Two times a day (BID) | INTRAMUSCULAR | Status: DC
  Administered 2014-07-19 – 2014-07-21 (×4): 60 mg via INTRAVENOUS
  Filled 2014-07-19 (×4): qty 2

## 2014-07-19 MED ORDER — VANCOMYCIN HCL IN DEXTROSE 1-5 GM/200ML-% IV SOLN
1000.0000 mg | Freq: Two times a day (BID) | INTRAVENOUS | Status: DC
  Administered 2014-07-20 – 2014-07-21 (×3): 1000 mg via INTRAVENOUS
  Filled 2014-07-19 (×3): qty 200

## 2014-07-19 MED ORDER — FAMOTIDINE 20 MG PO TABS
20.0000 mg | ORAL_TABLET | Freq: Two times a day (BID) | ORAL | Status: DC
  Administered 2014-07-19 – 2014-07-21 (×4): 20 mg via ORAL
  Filled 2014-07-19 (×4): qty 1

## 2014-07-19 MED ORDER — FLUDROCORTISONE ACETATE 0.1 MG PO TABS
0.1000 mg | ORAL_TABLET | Freq: Every day | ORAL | Status: DC
  Administered 2014-07-20 – 2014-07-21 (×2): 0.1 mg via ORAL
  Filled 2014-07-19 (×4): qty 1

## 2014-07-19 MED ORDER — ACETAMINOPHEN 325 MG PO TABS
650.0000 mg | ORAL_TABLET | Freq: Once | ORAL | Status: AC
  Administered 2014-07-19: 650 mg via ORAL
  Filled 2014-07-19: qty 2

## 2014-07-19 MED ORDER — VANCOMYCIN HCL IN DEXTROSE 1-5 GM/200ML-% IV SOLN
1000.0000 mg | INTRAVENOUS | Status: AC
  Administered 2014-07-19 (×2): 1000 mg via INTRAVENOUS
  Filled 2014-07-19 (×2): qty 200

## 2014-07-19 MED ORDER — ONDANSETRON HCL 4 MG/2ML IJ SOLN: 4.0000 mg | Freq: Four times a day (QID) | INTRAMUSCULAR | Status: DC | PRN

## 2014-07-19 MED ORDER — SODIUM CHLORIDE 0.9 % IV SOLN
INTRAVENOUS | Status: DC
  Administered 2014-07-19 (×2): via INTRAVENOUS

## 2014-07-19 MED ORDER — LORATADINE 10 MG PO TABS
10.0000 mg | ORAL_TABLET | Freq: Every day | ORAL | Status: DC
  Administered 2014-07-20 – 2014-07-21 (×2): 10 mg via ORAL
  Filled 2014-07-19 (×2): qty 1

## 2014-07-19 MED ORDER — PIPERACILLIN-TAZOBACTAM 3.375 G IVPB
3.3750 g | Freq: Three times a day (TID) | INTRAVENOUS | Status: DC
  Administered 2014-07-19 – 2014-07-21 (×5): 3.375 g via INTRAVENOUS
  Filled 2014-07-19 (×6): qty 50

## 2014-07-19 MED ORDER — GUAIFENESIN-DM 100-10 MG/5ML PO SYRP
5.0000 mL | ORAL_SOLUTION | ORAL | Status: DC | PRN
  Administered 2014-07-20 (×3): 5 mL via ORAL
  Filled 2014-07-19 (×3): qty 5

## 2014-07-19 MED ORDER — OXYCODONE-ACETAMINOPHEN 10-325 MG PO TABS: 1.0000 | ORAL_TABLET | Freq: Four times a day (QID) | ORAL | Status: DC

## 2014-07-19 MED ORDER — ACETAMINOPHEN 325 MG PO TABS: 650.0000 mg | ORAL_TABLET | Freq: Four times a day (QID) | ORAL | Status: DC | PRN

## 2014-07-19 MED ORDER — ENOXAPARIN SODIUM 60 MG/0.6ML ~~LOC~~ SOLN
60.0000 mg | SUBCUTANEOUS | Status: DC
  Administered 2014-07-19 – 2014-07-20 (×2): 60 mg via SUBCUTANEOUS
  Filled 2014-07-19 (×2): qty 0.6

## 2014-07-19 MED ORDER — ASPIRIN EC 81 MG PO TBEC
81.0000 mg | DELAYED_RELEASE_TABLET | Freq: Every day | ORAL | Status: DC
  Administered 2014-07-20 – 2014-07-21 (×2): 81 mg via ORAL
  Filled 2014-07-19 (×2): qty 1

## 2014-07-19 MED ORDER — ALBUTEROL SULFATE (2.5 MG/3ML) 0.083% IN NEBU: 2.5000 mg | INHALATION_SOLUTION | RESPIRATORY_TRACT | Status: DC | PRN

## 2014-07-19 MED ORDER — IPRATROPIUM-ALBUTEROL 0.5-2.5 (3) MG/3ML IN SOLN
3.0000 mL | Freq: Four times a day (QID) | RESPIRATORY_TRACT | Status: DC
  Administered 2014-07-19 – 2014-07-21 (×6): 3 mL via RESPIRATORY_TRACT
  Filled 2014-07-19 (×6): qty 3

## 2014-07-19 MED ORDER — IPRATROPIUM-ALBUTEROL 0.5-2.5 (3) MG/3ML IN SOLN
3.0000 mL | Freq: Once | RESPIRATORY_TRACT | Status: AC
  Administered 2014-07-19: 3 mL via RESPIRATORY_TRACT
  Filled 2014-07-19: qty 3

## 2014-07-19 MED ORDER — FLUTICASONE PROPIONATE 50 MCG/ACT NA SUSP
2.0000 | Freq: Every day | NASAL | Status: DC
  Administered 2014-07-20 – 2014-07-21 (×2): 2 via NASAL
  Filled 2014-07-19: qty 16

## 2014-07-19 NOTE — ED Notes (Signed)
Patient report cough, cold, congestion x 1 day. Congested cough. Oxygen sats 86% on RA with EMS, 98% on 2 L [place].

## 2014-07-19 NOTE — ED Notes (Signed)
MD at bedside. 

## 2014-07-19 NOTE — Progress Notes (Signed)
Assisted to bathroom.  Voided, passed stool.  Assisted back to bed.

## 2014-07-19 NOTE — Progress Notes (Signed)
ANTIBIOTIC CONSULT NOTE - INITIAL  Pharmacy Consult for Vancomycin & Zosyn  Indication: rule out sepsis  No Known Allergies  Patient Measurements: Height: 6\' 2"  (188 cm) Weight: 270 lb (122.471 kg) IBW/kg (Calculated) : 82.2  Vital Signs: Temp: 98.4 F (36.9 C) (04/02 1507) Temp Source: Rectal (04/02 1507) BP: 121/69 mmHg (04/02 1500) Pulse Rate: 94 (04/02 1500) Intake/Output from previous day:   Intake/Output from this shift:    Labs:  Recent Labs  07/19/14 1310  WBC 7.6  HGB 12.8*  PLT 138*  CREATININE 1.39*   Estimated Creatinine Clearance: 72.7 mL/min (by C-G formula based on Cr of 1.39). No results for input(s): VANCOTROUGH, VANCOPEAK, VANCORANDOM, GENTTROUGH, GENTPEAK, GENTRANDOM, TOBRATROUGH, TOBRAPEAK, TOBRARND, AMIKACINPEAK, AMIKACINTROU, AMIKACIN in the last 72 hours.   Microbiology: No results found for this or any previous visit (from the past 720 hour(s)).  Medical History: Past Medical History  Diagnosis Date  . Addison's disease   . Myocardial infarct 2001  . Chronic back pain   . Coronary artery disease   . Hypercholesterolemia   . Hypertension   . GERD (gastroesophageal reflux disease)   . Dyspnea    Medications:  Scheduled:     Assessment: 69 yo M admitted with generalized weakness, decreased O2 sats, and fever (Tm101.60F since admission).   WBC and lactic acid are wnl.  Source of fever unknown - CXR + vascular congestion/no infiltrate.  UA unremarkable.  Influenza PCR negative.  Cx data pending.   Scr elevated above patient's baseline.  NCrCl ~ 50-4655ml/min  Vancomycin >> Zosyn 4/2>>  Goal of Therapy:  Vancomycin trough level 15-20 mcg/ml  Plan:  Zosyn 3.375gm IV Q8h to be infused over 4hrs Vancomycin 2gm IV x1 now follwed by 1000mg  IV q12h Check Vancomycin trough at steady state Monitor renal function and cx data  Duration of therapy per MD  Elson ClanLilliston, Genean Adamski Michelle 07/19/2014,4:18 PM

## 2014-07-19 NOTE — ED Notes (Signed)
Pt given 125 mg Solu-medrol, 1 atrovent and 1 albuterol in route to ED by EMS.

## 2014-07-19 NOTE — ED Provider Notes (Signed)
CSN: [id number]     Arrival date & time 07/19/14  1252 History   First MD Initiated Contact with Patient 07/19/14 1306     Chief Complaint  Patient presents with  . Shortness of Breath  . Cough  . Fever      HPI  Pt was seen at 1305. Per EMS, pt's family and pt, c/o gradual onset and persistence of constant generalized weakness since yesterday. Pt told family he "didn't feel well" yesterday and "went to bed." Pt's family notes that he "didn't eat or drink anything since yesterday afternoon" and "was too weak to get up." Family checked his temp today and it was "102.9." Family gave motrin at 10am as well as "3 extra cortisone tablets" (as per previous instructions by his MD when pt has an illness). Pt told family he took his usua percocet "this morning" sometime before 10am. Pt states he has been "coughing" and "wheezing." EMS noted pt's O2 Sats 86% R/A on their arrival to scene. EMS gave IV solumedrol 125mg , as well as duoneb en route with Sats improving to 98% on O2 2L N/C. Denies headache, no CP/palpitations, no abd pain, no N/V/D, no focal motor weakness, no tingling/numbness in extremities.    Past Medical History  Diagnosis Date  . Addison's disease   . Myocardial infarct 2001  . Chronic back pain   . Coronary artery disease   . Hypercholesterolemia   . Hypertension   . GERD (gastroesophageal reflux disease)   . Dyspnea    Past Surgical History  Procedure Laterality Date  . Coronary artery bypass graft  2005  . Back surgery    . Coronary angioplasty with stent placement  2001    History  Substance Use Topics  . Smoking status: Current Every Day Smoker -- 1.00 packs/day    Types: Cigarettes  . Smokeless tobacco: Not on file  . Alcohol Use: No    Review of Systems ROS: Statement: All systems negative except as marked or noted in the HPI; Constitutional: +fever and chills, generalized weakness/fatigue, poor PO intake. ; ; Eyes: Negative for eye pain, redness and discharge.  ; ; ENMT: Negative for ear pain, hoarseness, nasal congestion, sinus pressure and sore throat. ; ; Cardiovascular: Negative for chest pain, palpitations, diaphoresis, dyspnea and peripheral edema. ; ; Respiratory: +cough, wheezing. Negative for stridor. ; ; Gastrointestinal: Negative for nausea, vomiting, diarrhea, abdominal pain, blood in stool, hematemesis, jaundice and rectal bleeding. . ; ; Genitourinary: Negative for dysuria, flank pain and hematuria. ; ; Musculoskeletal: Negative for back pain and neck pain. Negative for swelling and trauma.; ; Skin: Negative for pruritus, rash, abrasions, blisters, bruising and skin lesion.; ; Neuro: Negative for headache, lightheadedness and neck stiffness. Negative for altered level of consciousness , altered mental status, extremity weakness, paresthesias, involuntary movement, seizure and syncope.      Allergies  Review of patient's allergies indicates no known allergies.  Home Medications   Prior to Admission medications   Not on File   BP 123/51 mmHg  Pulse 104  Temp(Src) 101.4 F (38.6 C) (Rectal)  Resp 27  Ht 6\' 2"  (1.88 m)  Wt 270 lb (122.471 kg)  BMI 34.65 kg/m2  SpO2 95%   Filed Vitals:   07/19/14 1400 07/19/14 1430 07/19/14 1500 07/19/14 1507  BP: 133/67 132/71 121/69   Pulse: 96 91 94   Temp:    98.4 F (36.9 C)  TempSrc:    Rectal  Resp: 22 27 23    Height:  Weight:      SpO2: 94% 96% 94%      Physical Exam  1310: Physical examination:  Nursing notes reviewed; Vital signs and O2 SAT reviewed; +febrile.;; Constitutional: Well developed, Well nourished, In no acute distress; Head:  Normocephalic, atraumatic; Eyes: EOMI, PERRL, No scleral icterus; ENMT: Mouth and pharynx normal, Mucous membranes dry and cracked.; Neck: Supple, Full range of motion, No lymphadenopathy; Cardiovascular: Tachycardic rate and rhythm, No gallop; Respiratory: Breath sounds coarse & equal bilaterally, faint scattered wheezes.  No audible wheezing.  Speaking full sentences with ease, Normal respiratory effort/excursion; Chest: Nontender, Movement normal; Abdomen: Soft, Nontender, Nondistended, Normal bowel sounds; Genitourinary: No CVA tenderness; Extremities: Pulses normal, No tenderness, No edema, No calf edema or asymmetry.; Neuro: AA&Ox3, Major CN grossly intact.  Speech clear. No gross focal motor or sensory deficits in extremities.; Skin: Color normal, Warm, Dry.   ED Course  Procedures    EKG Interpretation   Date/Time:  Saturday July 19 2014 12:58:22 EDT Ventricular Rate:  100 PR Interval:  151 QRS Duration: 101 QT Interval:  363 QTC Calculation: 468 R Axis:   -33 Text Interpretation:  Sinus tachycardia Left axis deviation Low voltage,  precordial leads Borderline repolarization abnormality Nonspecific ST and  T wave abnormality Anterolateral leads When compared with ECG of 04/28/2003  Nonspecific ST and T wave abnormality is now Present Confirmed by [facility]   MD, Nicholos Johns [phone]) on 07/19/2014 1:23:52 PM      MDM  MDM Reviewed: previous chart, nursing note and vitals Reviewed previous: labs and ECG Interpretation: labs, ECG and x-ray      Results for orders placed or performed during the hospital encounter of 07/19/14  CBC  Result Value Ref Range   WBC 7.6 4.0 - 10.5 K/uL   RBC 4.32 4.22 - 5.81 MIL/uL   Hemoglobin 12.8 (L) 13.0 - 17.0 g/dL   HCT 60.4 (L) 54.0 - 98.1 %   MCV 86.8 78.0 - 100.0 fL   MCH 29.6 26.0 - 34.0 pg   MCHC 34.1 30.0 - 36.0 g/dL   RDW 19.1 47.8 - 29.5 %   Platelets 138 (L) 150 - 400 K/uL  Comprehensive metabolic panel  Result Value Ref Range   Sodium 132 (L) 135 - 145 mmol/L   Potassium 3.2 (L) 3.5 - 5.1 mmol/L   Chloride 101 96 - 112 mmol/L   CO2 21 19 - 32 mmol/L   Glucose, Bld 117 (H) 70 - 99 mg/dL   BUN 20 6 - 23 mg/dL   Creatinine, Ser 6.21 (H) 0.50 - 1.35 mg/dL   Calcium 8.3 (L) 8.4 - 10.5 mg/dL   Total Protein 6.9 6.0 - 8.3 g/dL   Albumin 4.1 3.5 - 5.2 g/dL   AST 39 (H)  0 - 37 U/L   ALT 42 0 - 53 U/L   Alkaline Phosphatase 66 39 - 117 U/L   Total Bilirubin 0.9 0.3 - 1.2 mg/dL   GFR calc non Af Amer 51 (L) >90 mL/min   GFR calc Af Amer 59 (L) >90 mL/min   Anion gap 10 5 - 15  Troponin I  Result Value Ref Range   Troponin I <0.03 <0.031 ng/mL  Lactic acid, plasma  Result Value Ref Range   Lactic Acid, Venous 1.2 0.5 - 2.0 mmol/L  Urinalysis, Routine w reflex microscopic  Result Value Ref Range   Color, Urine YELLOW YELLOW   APPearance CLEAR CLEAR   Specific Gravity, Urine 1.015 1.005 - 1.030   pH  6.5 5.0 - 8.0   Glucose, UA NEGATIVE NEGATIVE mg/dL   Hgb urine dipstick TRACE (A) NEGATIVE   Bilirubin Urine NEGATIVE NEGATIVE   Ketones, ur NEGATIVE NEGATIVE mg/dL   Protein, ur NEGATIVE NEGATIVE mg/dL   Urobilinogen, UA 0.2 0.0 - 1.0 mg/dL   Nitrite NEGATIVE NEGATIVE   Leukocytes, UA NEGATIVE NEGATIVE  Influenza panel by PCR (type A & B, H1N1)  Result Value Ref Range   Influenza A By PCR NEGATIVE NEGATIVE   Influenza B By PCR NEGATIVE NEGATIVE   H1N1 flu by pcr NOT DETECTED NOT DETECTED  Urine microscopic-add on  Result Value Ref Range   WBC, UA 0-2 <3 WBC/hpf   RBC / HPF 0-2 <3 RBC/hpf   Casts HYALINE CASTS (A) NEGATIVE   Dg Chest Port 1 View 07/19/2014   CLINICAL DATA:  Cough and shortness of Breath  EXAM: PORTABLE CHEST - 1 VIEW  COMPARISON:  None.  FINDINGS: Cardiac shadow is within normal limits. Postsurgical changes are seen. The lungs are well aerated bilaterally. Mild vascular congestion is seen. No focal confluent infiltrate is noted.  IMPRESSION: Mild vascular congestion.  No focal infiltrate is seen.   Electronically Signed   By: Alcide Clever M.D.   On: 07/19/2014 14:20   Results for TRAYLEN, ECKELS (MRN [id number]) as of 07/19/2014 15:40  Ref. Range 03/22/2007 11:54 05/15/2007 15:00 07/19/2014 13:10  BUN Latest Range: 6-23 mg/dL Creatinine Latest Range: 0.50-1.35 mg/dL 1.1 1.2 7.82 (H)    9562:  Fever improved after APAP. BP  remains stable. Duoneb given for faint wheezing. No clear source for fever at this time; will start IV vancomycin and zosyn for undifferentiated fever after [facility] x2 obtained. UC is pending. Dx and testing d/w pt and family.  Questions answered.  Verb understanding, agreeable to admit.  T/C to [facility]. Adrian Blackwater, case discussed, including:  HPI, pertinent PM/SHx, VS/PE, dx testing, ED course and treatment:  Agreeable to admit, requests to write temporary orders, obtain medical bed to Dr. Juanetta Gosling' service.    Samuel Jester, DO 07/21/14 2055

## 2014-07-19 NOTE — H&P (Signed)
History and Physical  Victor Nelson ZOX:[id number] DOB: 1945/09/04 DOA: 07/19/2014  Referring physician: Dr Clarene Duke, ED physician PCP: Fredirick Maudlin, MD   Chief Complaint: Fevers, hypersomnolence  HPI: Victor Nelson is a 68 y.o. male  With a history of absence disease with steroid dependence, hypertension, GERD, chronic back pain, history of myocardial infarction. Patient developed shortness of breath and cough with purulent sputum production and a fever began yesterday and rapidly progressed.  His dyspnea became worse with exertion and better with rest.  This morning, the patient was difficult to arouse by his family.  Patient was able to take his steroid dose, but fell back asleep afterwards. Patient was brought to the hospital for evaluation.  The patient is a smoker but has not been diagnosed with COPD.  Review of Systems:   Pt complains of shortness of breath, cough, sleepiness.  Pt denies any chest pain, palpitations, nausea, vomiting, diarrhea, constipation, abdominal pain, melena, hematemesis, rectal bleeding.  Review of systems are otherwise negative  Past Medical History  Diagnosis Date  . Addison's disease   . Myocardial infarct 2001  . Chronic back pain   . Coronary artery disease   . Hypercholesterolemia   . Hypertension   . GERD (gastroesophageal reflux disease)   . Dyspnea    Past Surgical History  Procedure Laterality Date  . Coronary artery bypass graft  2005  . Back surgery    . Coronary angioplasty with stent placement  2001   Social History:  reports that he has been smoking Cigarettes.  He has been smoking about 1.00 pack per day. He does not have any smokeless tobacco history on file. He reports that he does not drink alcohol or use illicit drugs. Patient lives at home & is able to participate in activities of daily living   No Known Allergies  No family history on file. family history of hypertension, lung disease   Prior to Admission medications    Medication Sig Start Date End Date Taking? Authorizing Provider  aspirin EC 81 MG tablet Take 81 mg by mouth daily.   Yes Historical Provider, MD  fexofenadine (ALLEGRA) 180 MG tablet Take 180 mg by mouth daily.   Yes Historical Provider, MD  fludrocortisone (FLORINEF) 0.1 MG tablet Take 0.1 mg by mouth daily.   Yes Historical Provider, MD  fluticasone (FLONASE) 50 MCG/ACT nasal spray Place 2 sprays into both nostrils daily.   Yes Historical Provider, MD  hydrocortisone (CORTEF) 10 MG tablet Take 20-40 mg by mouth 2 (two) times daily.   Yes Historical Provider, MD  nitroGLYCERIN (NITROSTAT) 0.4 MG SL tablet Place 0.4 mg under the tongue every 5 (five) minutes as needed for chest pain.   Yes Historical Provider, MD  oxyCODONE-acetaminophen (PERCOCET) 10-325 MG per tablet Take 1 tablet by mouth 4 (four) times daily.   Yes Historical Provider, MD  ranitidine (ZANTAC) 150 MG tablet Take 150 mg by mouth daily.   Yes Historical Provider, MD    Physical Exam: BP 124/49 mmHg  Pulse 75  Temp(Src) 97.4 F (36.3 C) (Axillary)  Resp 20  Ht  (1.88 m)  Wt 124.013 kg (273 lb 6.4 oz)  BMI 35.09 kg/m2  SpO2 96%  General: Older Caucasian male. The patient was quite somnolent when I first arrived but after several attempts of waking him and finally getting the patient to sit up, the patient was able to converse fluently. Awake and alert and oriented x3. No acute cardiopulmonary distress.  Eyes: Pupils  equal, round, reactive to light. Extraocular muscles are intact. Sclerae anicteric and noninjected.  ENT: Moist mucosal membranes. No mucosal lesions.  Neck: Neck supple without lymphadenopathy. No carotid bruits. No masses palpated.  Cardiovascular: Regular rate with normal S1-S2 sounds. No murmurs, rubs, gallops auscultated. No JVD.  Respiratory: Prolonged exhalation phase. Marked expiratory wheezing. No Rales noted. Abdomen: Soft, nontender, nondistended. Active bowel sounds. No masses or  hepatosplenomegaly  Skin: Dry, warm to touch. 2+ dorsalis pedis and radial pulses. Musculoskeletal: No calf or leg pain. All major joints not erythematous nontender.  Psychiatric: Intact judgment and insight.  Neurologic: No focal neurological deficits. Cranial nerves II through XII are grossly intact.           Labs on Admission:  Basic Metabolic Panel:  Recent Labs Lab 07/19/14 1310  NA 132*  K 3.2*  CL 101  CO2 21  GLUCOSE 117*  BUN 20  CREATININE 1.39*  CALCIUM 8.3*   Liver Function Tests:  Recent Labs Lab 07/19/14 1310  AST 39*  ALT 42  ALKPHOS 66  BILITOT 0.9  PROT 6.9  ALBUMIN 4.1   No results for input(s): LIPASE, AMYLASE in the last 168 hours. No results for input(s): AMMONIA in the last 168 hours. CBC:  Recent Labs Lab 07/19/14 1310  WBC 7.6  HGB 12.8*  HCT 37.5*  MCV 86.8  PLT 138*   Cardiac Enzymes:  Recent Labs Lab 07/19/14 1310  TROPONINI <0.03    BNP (last 3 results) No results for input(s): BNP in the last 8760 hours.  ProBNP (last 3 results) No results for input(s): PROBNP in the last 8760 hours.  CBG: No results for input(s): GLUCAP in the last 168 hours.  Radiological Exams on Admission: Dg Chest Port 1 View  07/19/2014   CLINICAL DATA:  Cough and shortness of Breath  EXAM: PORTABLE CHEST - 1 VIEW  COMPARISON:  None.  FINDINGS: Cardiac shadow is within normal limits. Postsurgical changes are seen. The lungs are well aerated bilaterally. Mild vascular congestion is seen. No focal confluent infiltrate is noted.  IMPRESSION: Mild vascular congestion.  No focal infiltrate is seen.   Electronically Signed   By: Alcide CleverMark  Lukens M.D.   On: 07/19/2014 14:20    EKG: Independently reviewed. Sinus tachycardia with left axis deviation.  No acute ST elevation  Assessment/Plan Present on Admission:  . Fever . Acute respiratory failure with hypoxia . Acute bronchitis . Somnolence  #1 acute respiratory failure with hypoxia #2 fever #3  acute bronchitis #4 somnolence  #5 Addison's disease #6 steroid dependence #7 hypertension  Due the patient's somnolence, a ABG was obtained to assure no dramatic CO2 retention as an etiology of the patient's somnolence. The patient has a normal PCO2 of 41 and a normal pH. We'll admit the patient to MedSurg as the patient remains arousable Will continue the patient's steroids in treatment of his bronchitis as well as treatment of his steroid dependence and Addison's disease - we'll maintain Solu-Medrol at 60 mg twice daily Nebulizer treatments Oxygen via nasal cannula Tylenol for his fever Given that the patient has a potential to have resistant bacteria, the patient was started on proximal rectum antibiotics. Will obtain a MRSA screen via PCR. If this is normal, can discontinue the vancomycin and continue with Zosyn.  DVT prophylaxis: Lovenox  Consultants: None  Code Status: Full code  Family Communication: None   Disposition Plan: Home following stabilization  Time spent: 70 minutes was spent with face-to-face time with patient  with at least 50% with counseling and coordination of care  Levie Heritage, DO [facility] Pager [phone]

## 2014-07-20 LAB — CBC
HEMATOCRIT: 37.2 % — AB (ref 39.0–52.0)
HEMOGLOBIN: 12.6 g/dL — AB (ref 13.0–17.0)
MCH: 29.2 pg (ref 26.0–34.0)
MCHC: 33.9 g/dL (ref 30.0–36.0)
MCV: 86.1 fL (ref 78.0–100.0)
Platelets: 148 10*3/uL — ABNORMAL LOW (ref 150–400)
RBC: 4.32 MIL/uL (ref 4.22–5.81)
RDW: 14.2 % (ref 11.5–15.5)
WBC: 10.8 10*3/uL — ABNORMAL HIGH (ref 4.0–10.5)

## 2014-07-20 LAB — BASIC METABOLIC PANEL
Anion gap: 10 (ref 5–15)
BUN: 15 mg/dL (ref 6–23)
CO2: 22 mmol/L (ref 19–32)
Calcium: 8.6 mg/dL (ref 8.4–10.5)
Chloride: 104 mmol/L (ref 96–112)
Creatinine, Ser: 1.06 mg/dL (ref 0.50–1.35)
GFR, EST AFRICAN AMERICAN: 83 mL/min — AB (ref >90)
GFR, EST NON AFRICAN AMERICAN: 71 mL/min — AB (ref >90)
GLUCOSE: 209 mg/dL — AB (ref 70–99)
Potassium: 3.9 mmol/L (ref 3.5–5.1)
Sodium: 136 mmol/L (ref 135–145)

## 2014-07-20 LAB — MRSA PCR SCREENING: MRSA by PCR: NEGATIVE

## 2014-07-20 MED ORDER — PIPERACILLIN-TAZOBACTAM 3.375 G IVPB
INTRAVENOUS | Status: AC
  Filled 2014-07-20: qty 50

## 2014-07-20 MED ORDER — VANCOMYCIN HCL IN DEXTROSE 1-5 GM/200ML-% IV SOLN
INTRAVENOUS | Status: AC
  Filled 2014-07-20: qty 200

## 2014-07-20 NOTE — Progress Notes (Signed)
Subjective: This is a 69 year old who was admitted yesterday with acute respiratory failure/acute bronchitis and potential pneumonia. He is immunocompromised because of Addison's disease and the treatment for that. He says he feels better  Objective: Vital signs in last 24 hours: Temp:  [97.4 F (36.3 C)-101.4 F (38.6 C)] 97.9 F (36.6 C) (04/03 0529) Pulse Rate:  [61-104] 79 (04/03 0529) Resp:  [20-27] 20 (04/03 0529) BP: (117-134)/(49-75) 119/60 mmHg (04/03 0529) SpO2:  [86 %-98 %] 98 % (04/03 0529) Weight:  [122.471 kg (270 lb)-124.013 kg (273 lb 6.4 oz)] 124.013 kg (273 lb 6.4 oz) (04/02 1708) Weight change:  Last BM Date: 07/19/14  Intake/Output from previous day: 04/02 0701 - 04/03 0700 In: 923.3 [I.V.:523.3; IV Piggyback:400] Out: -   PHYSICAL EXAM General appearance: alert, cooperative and mild distress Resp: rhonchi bilaterally Cardio: regular rate and rhythm, S1, S2 normal, no murmur, click, rub or gallop GI: soft, non-tender; bowel sounds normal; no masses,  no organomegaly Extremities: extremities normal, atraumatic, no cyanosis or edema  Lab Results:  Results for orders placed or performed during the hospital encounter of 07/19/14 (from the past 48 hour(s))  CBC     Status: Abnormal   Collection Time: 07/19/14  1:10 PM  Result Value Ref Range   WBC 7.6 4.0 - 10.5 K/uL   RBC 4.32 4.22 - 5.81 MIL/uL   Hemoglobin 12.8 (L) 13.0 - 17.0 g/dL   HCT 37.5 (L) 39.0 - 52.0 %   MCV 86.8 78.0 - 100.0 fL   MCH 29.6 26.0 - 34.0 pg   MCHC 34.1 30.0 - 36.0 g/dL   RDW 14.1 11.5 - 15.5 %   Platelets 138 (L) 150 - 400 K/uL  Comprehensive metabolic panel     Status: Abnormal   Collection Time: 07/19/14  1:10 PM  Result Value Ref Range   Sodium 132 (L) 135 - 145 mmol/L   Potassium 3.2 (L) 3.5 - 5.1 mmol/L   Chloride 101 96 - 112 mmol/L   CO2 21 19 - 32 mmol/L   Glucose, Bld 117 (H) 70 - 99 mg/dL   BUN 20 6 - 23 mg/dL   Creatinine, Ser 1.39 (H) 0.50 - 1.35 mg/dL   Calcium  8.3 (L) 8.4 - 10.5 mg/dL   Total Protein 6.9 6.0 - 8.3 g/dL   Albumin 4.1 3.5 - 5.2 g/dL   AST 39 (H) 0 - 37 U/L   ALT 42 0 - 53 U/L   Alkaline Phosphatase 66 39 - 117 U/L   Total Bilirubin 0.9 0.3 - 1.2 mg/dL   GFR calc non Af Amer 51 (L) >90 mL/min   GFR calc Af Amer 59 (L) >90 mL/min    Comment: (NOTE) The eGFR has been calculated using the CKD EPI equation. This calculation has not been validated in all clinical situations. eGFR's persistently <90 mL/min signify possible Chronic Kidney Disease.    Anion gap 10 5 - 15  Troponin I     Status: None   Collection Time: 07/19/14  1:10 PM  Result Value Ref Range   Troponin I <0.03 <0.031 ng/mL    Comment:        NO INDICATION OF MYOCARDIAL INJURY.   Lactic acid, plasma     Status: None   Collection Time: 07/19/14  1:11 PM  Result Value Ref Range   Lactic Acid, Venous 1.2 0.5 - 2.0 mmol/L  Influenza panel by PCR (type A & B, H1N1)     Status: None  Collection Time: 07/19/14  1:50 PM  Result Value Ref Range   Influenza A By PCR NEGATIVE NEGATIVE   Influenza B By PCR NEGATIVE NEGATIVE   H1N1 flu by pcr NOT DETECTED NOT DETECTED    Comment:        The Xpert Flu assay (FDA approved for nasal aspirates or washes and nasopharyngeal swab specimens), is intended as an aid in the diagnosis of influenza and should not be used as a sole basis for treatment.   Urinalysis, Routine w reflex microscopic     Status: Abnormal   Collection Time: 07/19/14  2:07 PM  Result Value Ref Range   Color, Urine YELLOW YELLOW   APPearance CLEAR CLEAR   Specific Gravity, Urine 1.015 1.005 - 1.030   pH 6.5 5.0 - 8.0   Glucose, UA NEGATIVE NEGATIVE mg/dL   Hgb urine dipstick TRACE (A) NEGATIVE   Bilirubin Urine NEGATIVE NEGATIVE   Ketones, ur NEGATIVE NEGATIVE mg/dL   Protein, ur NEGATIVE NEGATIVE mg/dL   Urobilinogen, UA 0.2 0.0 - 1.0 mg/dL   Nitrite NEGATIVE NEGATIVE   Leukocytes, UA NEGATIVE NEGATIVE  Urine microscopic-add on     Status:  Abnormal   Collection Time: 07/19/14  2:07 PM  Result Value Ref Range   WBC, UA 0-2 <3 WBC/hpf   RBC / HPF 0-2 <3 RBC/hpf   Casts HYALINE CASTS (A) NEGATIVE  Lactic acid, plasma     Status: None   Collection Time: 07/19/14  4:12 PM  Result Value Ref Range   Lactic Acid, Venous 1.6 0.5 - 2.0 mmol/L  Blood gas, arterial     Status: Abnormal   Collection Time: 07/19/14  6:20 PM  Result Value Ref Range   O2 Content 2.0 L/min   Delivery systems NASAL CANNULA    pH, Arterial 7.328 (L) 7.350 - 7.450   pCO2 arterial 41.0 35.0 - 45.0 mmHg   pO2, Arterial 67.0 (L) 80.0 - 100.0 mmHg   Bicarbonate 20.9 20.0 - 24.0 mEq/L   TCO2 19.1 0 - 100 mmol/L   Acid-base deficit 4.2 (H) 0.0 - 2.0 mmol/L   O2 Saturation 91.6 %   Collection site RIGHT RADIAL    Drawn by [phone]    Sample type ARTERIAL DRAW    Allens test (pass/fail) PASS PASS  MRSA PCR Screening     Status: None   Collection Time: 07/19/14  6:35 PM  Result Value Ref Range   MRSA by PCR NEGATIVE NEGATIVE    Comment:        The GeneXpert MRSA Assay (FDA approved for NASAL specimens only), is one component of a comprehensive MRSA colonization surveillance program. It is not intended to diagnose MRSA infection nor to guide or monitor treatment for MRSA infections.   CBC     Status: Abnormal   Collection Time: 07/20/14  6:56 AM  Result Value Ref Range   WBC 10.8 (H) 4.0 - 10.5 K/uL   RBC 4.32 4.22 - 5.81 MIL/uL   Hemoglobin 12.6 (L) 13.0 - 17.0 g/dL   HCT 37.2 (L) 39.0 - 52.0 %   MCV 86.1 78.0 - 100.0 fL   MCH 29.2 26.0 - 34.0 pg   MCHC 33.9 30.0 - 36.0 g/dL   RDW 14.2 11.5 - 15.5 %   Platelets 148 (L) 150 - 400 K/uL  Basic metabolic panel     Status: Abnormal   Collection Time: 07/20/14  6:56 AM  Result Value Ref Range   Sodium 136 135 - 145  mmol/L   Potassium 3.9 3.5 - 5.1 mmol/L    Comment: DELTA CHECK NOTED   Chloride 104 96 - 112 mmol/L   CO2 22 19 - 32 mmol/L   Glucose, Bld 209 (H) 70 - 99 mg/dL   BUN 15 6 - 23  mg/dL   Creatinine, Ser 1.06 0.50 - 1.35 mg/dL   Calcium 8.6 8.4 - 10.5 mg/dL   GFR calc non Af Amer 71 (L) >90 mL/min   GFR calc Af Amer 83 (L) >90 mL/min    Comment: (NOTE) The eGFR has been calculated using the CKD EPI equation. This calculation has not been validated in all clinical situations. eGFR's persistently <90 mL/min signify possible Chronic Kidney Disease.    Anion gap 10 5 - 15    ABGS  Recent Labs  07/19/14 1820  PHART 7.328*  PO2ART 67.0*  TCO2 19.1  HCO3 20.9   CULTURES Recent Results (from the past 240 hour(s))  MRSA PCR Screening     Status: None   Collection Time: 07/19/14  6:35 PM  Result Value Ref Range Status   MRSA by PCR NEGATIVE NEGATIVE Final    Comment:        The GeneXpert MRSA Assay (FDA approved for NASAL specimens only), is one component of a comprehensive MRSA colonization surveillance program. It is not intended to diagnose MRSA infection nor to guide or monitor treatment for MRSA infections.    Studies/Results: Dg Chest Port 1 View  07/19/2014   CLINICAL DATA:  Cough and shortness of Breath  EXAM: PORTABLE CHEST - 1 VIEW  COMPARISON:  None.  FINDINGS: Cardiac shadow is within normal limits. Postsurgical changes are seen. The lungs are well aerated bilaterally. Mild vascular congestion is seen. No focal confluent infiltrate is noted.  IMPRESSION: Mild vascular congestion.  No focal infiltrate is seen.   Electronically Signed   By: Inez Catalina M.D.   On: 07/19/2014 14:20    Medications:  Prior to Admission:  Prescriptions prior to admission  Medication Sig Dispense Refill Last Dose  . aspirin EC 81 MG tablet Take 81 mg by mouth daily.   07/19/2014 at Unknown time  . fexofenadine (ALLEGRA) 180 MG tablet Take 180 mg by mouth daily.   07/19/2014 at Unknown time  . fludrocortisone (FLORINEF) 0.1 MG tablet Take 0.1 mg by mouth daily.   07/19/2014 at Unknown time  . fluticasone (FLONASE) 50 MCG/ACT nasal spray Place 2 sprays into both  nostrils daily.   07/19/2014 at Unknown time  . hydrocortisone (CORTEF) 10 MG tablet Take 20-40 mg by mouth 2 (two) times daily.   07/19/2014 at Unknown time  . nitroGLYCERIN (NITROSTAT) 0.4 MG SL tablet Place 0.4 mg under the tongue every 5 (five) minutes as needed for chest pain.   unknown  . oxyCODONE-acetaminophen (PERCOCET) 10-325 MG per tablet Take 1 tablet by mouth 4 (four) times daily.   07/19/2014 at 430  . ranitidine (ZANTAC) 150 MG tablet Take 150 mg by mouth daily.   07/19/2014 at Unknown time   Scheduled: . sodium chloride   Intravenous STAT  . aspirin EC  81 mg Oral Daily  . enoxaparin (LOVENOX) injection  60 mg Subcutaneous Q24H  . famotidine  20 mg Oral BID  . fludrocortisone  0.1 mg Oral Daily  . fluticasone  2 spray Each Nare Daily  . ipratropium-albuterol  3 mL Nebulization Q6H  . loratadine  10 mg Oral Daily  . methylPREDNISolone (SOLU-MEDROL) injection  60 mg Intravenous Q12H  .  oxyCODONE-acetaminophen  1 tablet Oral 4 times per day   And  . oxyCODONE  5 mg Oral 4 times per day  . piperacillin-tazobactam (ZOSYN)  IV  3.375 g Intravenous Q8H  . vancomycin  1,000 mg Intravenous Q12H   Continuous:  MAY:[id number] **OR** acetaminophen, albuterol, alum & mag hydroxide-simeth, guaiFENesin-dextromethorphan, ondansetron **OR** ondansetron (ZOFRAN) IV  Assesment: He was admitted with acute febrile illness and acute respiratory failure with hypoxia. He is being treated with IV antibiotics and seems to be better. He was somnolent on admission but that has improved. He has Addison's disease at baseline. He also has coronary artery occlusive disease but that is stable Active Problems:   Fever   Acute respiratory failure with hypoxia   Acute bronchitis   Somnolence    Plan: No change in treatments. Continue IV antibiotics and IV steroids etc.    LOS: 1 day   Michaella Imai L 07/20/2014, 7:31 AM

## 2014-07-20 NOTE — Progress Notes (Signed)
Utilization review Completed Armin Yerger RN BSN   

## 2014-07-21 DIAGNOSIS — E271 Primary adrenocortical insufficiency: Secondary | ICD-10-CM | POA: Diagnosis present

## 2014-07-21 LAB — URINE CULTURE
Colony Count: NO GROWTH
Culture: NO GROWTH

## 2014-07-21 MED ORDER — LEVOFLOXACIN 500 MG PO TABS
500.0000 mg | ORAL_TABLET | Freq: Every day | ORAL | Status: DC
  Administered 2014-07-21: 500 mg via ORAL
  Filled 2014-07-21: qty 1

## 2014-07-21 MED ORDER — ALBUTEROL SULFATE HFA 108 (90 BASE) MCG/ACT IN AERS: 2.0000 | INHALATION_SPRAY | Freq: Four times a day (QID) | RESPIRATORY_TRACT | Status: AC | PRN

## 2014-07-21 MED ORDER — METHYLPREDNISOLONE (PAK) 4 MG PO TABS: ORAL_TABLET | ORAL | Status: DC

## 2014-07-21 MED ORDER — LEVOFLOXACIN 500 MG PO TABS: 500.0000 mg | ORAL_TABLET | Freq: Every day | ORAL | Status: DC

## 2014-07-21 MED ORDER — PREDNISONE 20 MG PO TABS
40.0000 mg | ORAL_TABLET | Freq: Every day | ORAL | Status: DC
  Administered 2014-07-21: 40 mg via ORAL
  Filled 2014-07-21: qty 2

## 2014-07-21 NOTE — Care Management Note (Signed)
    Page 1 of 1   07/21/2014     9:07:37 AM CARE MANAGEMENT NOTE 07/21/2014  Patient:  Victor Nelson,Victor Nelson   Account Number:  [id number]  Date Initiated:  07/21/2014  Documentation initiated by:  Kathyrn SheriffHILDRESS,JESSICA  Subjective/Objective Assessment:   Pt is from home. Pt independent. Pt discharging home today. No CM needs.     Action/Plan:   Anticipated DC Date:  07/21/2014   Anticipated DC Plan:  HOME/SELF CARE      DC Planning Services  CM consult      Choice offered to / List presented to:             Status of service:  Completed, signed off Medicare Important Message given?   (If response is "NO", the following Medicare IM given date fields will be blank) Date Medicare IM given:   Medicare IM given by:   Date Additional Medicare IM given:   Additional Medicare IM given by:    Discharge Disposition:  HOME/SELF CARE  Per UR Regulation:  Reviewed for med. necessity/level of care/duration of stay  If discussed at Long Length of Stay Meetings, dates discussed:    Comments:  07/21/2014 0900 Kathyrn SheriffJessica Childress, RN, MSN, CM

## 2014-07-21 NOTE — Care Management Utilization Note (Signed)
UR completed 

## 2014-07-21 NOTE — Progress Notes (Signed)
Patient discharged home today.  Patient was given discharge instructions and prescriptions were sent into the pharmacy.  Patient verbalized understanding with no complaints or concerns voiced at this time.  IV was removed with catheter intact, no bleeding or complications.  Patient left unit in stable condition ambulating with a staff member.

## 2014-07-21 NOTE — Progress Notes (Signed)
Subjective: He feels well and wants to go home. He is still coughing up some sputum.  Objective: Vital signs in last 24 hours: Temp:  [98 F (36.7 C)-98.4 F (36.9 C)] 98.2 F (36.8 C) (04/04 0526) Pulse Rate:  [62-84] 62 (04/04 0526) Resp:  [20] 20 (04/04 0526) BP: (121-131)/(58-65) 121/65 mmHg (04/04 0526) SpO2:  [93 %-97 %] 93 % (04/04 0726) Weight change:  Last BM Date: 07/20/14  Intake/Output from previous day: 04/03 0701 - 04/04 0700 In: 1270 [P.O.:720; IV Piggyback:550] Out: 1200 [Urine:1200]  PHYSICAL EXAM General appearance: alert, cooperative and no distress Resp: rhonchi bilaterally Cardio: regular rate and rhythm, S1, S2 normal, no murmur, click, rub or gallop GI: soft, non-tender; bowel sounds normal; no masses,  no organomegaly Extremities: extremities normal, atraumatic, no cyanosis or edema  Lab Results:  Results for orders placed or performed during the hospital encounter of 07/19/14 (from the past 48 hour(s))  Blood culture (routine x 2)     Status: None (Preliminary result)   Collection Time: 07/19/14  1:07 PM  Result Value Ref Range   Specimen Description RIGHT ANTECUBITAL    Special Requests BOTTLES DRAWN AEROBIC ONLY 6CC    Culture NO GROWTH 1 DAY    Report Status PENDING   CBC     Status: Abnormal   Collection Time: 07/19/14  1:10 PM  Result Value Ref Range   WBC 7.6 4.0 - 10.5 K/uL   RBC 4.32 4.22 - 5.81 MIL/uL   Hemoglobin 12.8 (L) 13.0 - 17.0 g/dL   HCT 37.5 (L) 39.0 - 52.0 %   MCV 86.8 78.0 - 100.0 fL   MCH 29.6 26.0 - 34.0 pg   MCHC 34.1 30.0 - 36.0 g/dL   RDW 14.1 11.5 - 15.5 %   Platelets 138 (L) 150 - 400 K/uL  Comprehensive metabolic panel     Status: Abnormal   Collection Time: 07/19/14  1:10 PM  Result Value Ref Range   Sodium 132 (L) 135 - 145 mmol/L   Potassium 3.2 (L) 3.5 - 5.1 mmol/L   Chloride 101 96 - 112 mmol/L   CO2 21 19 - 32 mmol/L   Glucose, Bld 117 (H) 70 - 99 mg/dL   BUN 20 6 - 23 mg/dL   Creatinine, Ser 1.39  (H) 0.50 - 1.35 mg/dL   Calcium 8.3 (L) 8.4 - 10.5 mg/dL   Total Protein 6.9 6.0 - 8.3 g/dL   Albumin 4.1 3.5 - 5.2 g/dL   AST 39 (H) 0 - 37 U/L   ALT 42 0 - 53 U/L   Alkaline Phosphatase 66 39 - 117 U/L   Total Bilirubin 0.9 0.3 - 1.2 mg/dL   GFR calc non Af Amer 51 (L) >90 mL/min   GFR calc Af Amer 59 (L) >90 mL/min    Comment: (NOTE) The eGFR has been calculated using the CKD EPI equation. This calculation has not been validated in all clinical situations. eGFR's persistently <90 mL/min signify possible Chronic Kidney Disease.    Anion gap 10 5 - 15  Troponin I     Status: None   Collection Time: 07/19/14  1:10 PM  Result Value Ref Range   Troponin I <0.03 <0.031 ng/mL    Comment:        NO INDICATION OF MYOCARDIAL INJURY.   Lactic acid, plasma     Status: None   Collection Time: 07/19/14  1:11 PM  Result Value Ref Range   Lactic Acid, Venous 1.2 0.5 -  2.0 mmol/L  Influenza panel by PCR (type A & B, H1N1)     Status: None   Collection Time: 07/19/14  1:50 PM  Result Value Ref Range   Influenza A By PCR NEGATIVE NEGATIVE   Influenza B By PCR NEGATIVE NEGATIVE   H1N1 flu by pcr NOT DETECTED NOT DETECTED    Comment:        The Xpert Flu assay (FDA approved for nasal aspirates or washes and nasopharyngeal swab specimens), is intended as an aid in the diagnosis of influenza and should not be used as a sole basis for treatment.   Urinalysis, Routine w reflex microscopic     Status: Abnormal   Collection Time: 07/19/14  2:07 PM  Result Value Ref Range   Color, Urine YELLOW YELLOW   APPearance CLEAR CLEAR   Specific Gravity, Urine 1.015 1.005 - 1.030   pH 6.5 5.0 - 8.0   Glucose, UA NEGATIVE NEGATIVE mg/dL   Hgb urine dipstick TRACE (A) NEGATIVE   Bilirubin Urine NEGATIVE NEGATIVE   Ketones, ur NEGATIVE NEGATIVE mg/dL   Protein, ur NEGATIVE NEGATIVE mg/dL   Urobilinogen, UA 0.2 0.0 - 1.0 mg/dL   Nitrite NEGATIVE NEGATIVE   Leukocytes, UA NEGATIVE NEGATIVE  Urine  microscopic-add on     Status: Abnormal   Collection Time: 07/19/14  2:07 PM  Result Value Ref Range   WBC, UA 0-2 <3 WBC/hpf   RBC / HPF 0-2 <3 RBC/hpf   Casts HYALINE CASTS (A) NEGATIVE  Lactic acid, plasma     Status: None   Collection Time: 07/19/14  4:12 PM  Result Value Ref Range   Lactic Acid, Venous 1.6 0.5 - 2.0 mmol/L  Blood culture (routine x 2)     Status: None (Preliminary result)   Collection Time: 07/19/14  4:21 PM  Result Value Ref Range   Specimen Description RIGHT ANTECUBITAL    Special Requests BOTTLES DRAWN AEROBIC AND ANAEROBIC 6CC    Culture NO GROWTH 1 DAY    Report Status PENDING   Blood gas, arterial     Status: Abnormal   Collection Time: 07/19/14  6:20 PM  Result Value Ref Range   O2 Content 2.0 L/min   Delivery systems NASAL CANNULA    pH, Arterial 7.328 (L) 7.350 - 7.450   pCO2 arterial 41.0 35.0 - 45.0 mmHg   pO2, Arterial 67.0 (L) 80.0 - 100.0 mmHg   Bicarbonate 20.9 20.0 - 24.0 mEq/L   TCO2 19.1 0 - 100 mmol/L   Acid-base deficit 4.2 (H) 0.0 - 2.0 mmol/L   O2 Saturation 91.6 %   Collection site RIGHT RADIAL    Drawn by [phone]    Sample type ARTERIAL DRAW    Allens test (pass/fail) PASS PASS  MRSA PCR Screening     Status: None   Collection Time: 07/19/14  6:35 PM  Result Value Ref Range   MRSA by PCR NEGATIVE NEGATIVE    Comment:        The GeneXpert MRSA Assay (FDA approved for NASAL specimens only), is one component of a comprehensive MRSA colonization surveillance program. It is not intended to diagnose MRSA infection nor to guide or monitor treatment for MRSA infections.   CBC     Status: Abnormal   Collection Time: 07/20/14  6:56 AM  Result Value Ref Range   WBC 10.8 (H) 4.0 - 10.5 K/uL   RBC 4.32 4.22 - 5.81 MIL/uL   Hemoglobin 12.6 (L) 13.0 - 17.0 g/dL  HCT 37.2 (L) 39.0 - 52.0 %   MCV 86.1 78.0 - 100.0 fL   MCH 29.2 26.0 - 34.0 pg   MCHC 33.9 30.0 - 36.0 g/dL   RDW 14.2 11.5 - 15.5 %   Platelets 148 (L) 150 - 400 K/uL   Basic metabolic panel     Status: Abnormal   Collection Time: 07/20/14  6:56 AM  Result Value Ref Range   Sodium 136 135 - 145 mmol/L   Potassium 3.9 3.5 - 5.1 mmol/L    Comment: DELTA CHECK NOTED   Chloride 104 96 - 112 mmol/L   CO2 22 19 - 32 mmol/L   Glucose, Bld 209 (H) 70 - 99 mg/dL   BUN 15 6 - 23 mg/dL   Creatinine, Ser 1.06 0.50 - 1.35 mg/dL   Calcium 8.6 8.4 - 10.5 mg/dL   GFR calc non Af Amer 71 (L) >90 mL/min   GFR calc Af Amer 83 (L) >90 mL/min    Comment: (NOTE) The eGFR has been calculated using the CKD EPI equation. This calculation has not been validated in all clinical situations. eGFR's persistently <90 mL/min signify possible Chronic Kidney Disease.    Anion gap 10 5 - 15    ABGS  Recent Labs  07/19/14 1820  PHART 7.328*  PO2ART 67.0*  TCO2 19.1  HCO3 20.9   CULTURES Recent Results (from the past 240 hour(s))  Blood culture (routine x 2)     Status: None (Preliminary result)   Collection Time: 07/19/14  1:07 PM  Result Value Ref Range Status   Specimen Description RIGHT ANTECUBITAL  Final   Special Requests BOTTLES DRAWN AEROBIC ONLY 6CC  Final   Culture NO GROWTH 1 DAY  Final   Report Status PENDING  Incomplete  Blood culture (routine x 2)     Status: None (Preliminary result)   Collection Time: 07/19/14  4:21 PM  Result Value Ref Range Status   Specimen Description RIGHT ANTECUBITAL  Final   Special Requests BOTTLES DRAWN AEROBIC AND ANAEROBIC 6CC  Final   Culture NO GROWTH 1 DAY  Final   Report Status PENDING  Incomplete  MRSA PCR Screening     Status: None   Collection Time: 07/19/14  6:35 PM  Result Value Ref Range Status   MRSA by PCR NEGATIVE NEGATIVE Final    Comment:        The GeneXpert MRSA Assay (FDA approved for NASAL specimens only), is one component of a comprehensive MRSA colonization surveillance program. It is not intended to diagnose MRSA infection nor to guide or monitor treatment for MRSA infections.     Studies/Results: Dg Chest Port 1 View  07/19/2014   CLINICAL DATA:  Cough and shortness of Breath  EXAM: PORTABLE CHEST - 1 VIEW  COMPARISON:  None.  FINDINGS: Cardiac shadow is within normal limits. Postsurgical changes are seen. The lungs are well aerated bilaterally. Mild vascular congestion is seen. No focal confluent infiltrate is noted.  IMPRESSION: Mild vascular congestion.  No focal infiltrate is seen.   Electronically Signed   By: Inez Catalina M.D.   On: 07/19/2014 14:20    Medications:  Prior to Admission:  Prescriptions prior to admission  Medication Sig Dispense Refill Last Dose  . aspirin EC 81 MG tablet Take 81 mg by mouth daily.   07/19/2014 at Unknown time  . fexofenadine (ALLEGRA) 180 MG tablet Take 180 mg by mouth daily.   07/19/2014 at Unknown time  . fludrocortisone (FLORINEF)  0.1 MG tablet Take 0.1 mg by mouth daily.   07/19/2014 at Unknown time  . fluticasone (FLONASE) 50 MCG/ACT nasal spray Place 2 sprays into both nostrils daily.   07/19/2014 at Unknown time  . hydrocortisone (CORTEF) 10 MG tablet Take 20-40 mg by mouth 2 (two) times daily.   07/19/2014 at Unknown time  . nitroGLYCERIN (NITROSTAT) 0.4 MG SL tablet Place 0.4 mg under the tongue every 5 (five) minutes as needed for chest pain.   unknown  . oxyCODONE-acetaminophen (PERCOCET) 10-325 MG per tablet Take 1 tablet by mouth 4 (four) times daily.   07/19/2014 at 430  . ranitidine (ZANTAC) 150 MG tablet Take 150 mg by mouth daily.   07/19/2014 at Unknown time   Scheduled: . aspirin EC  81 mg Oral Daily  . enoxaparin (LOVENOX) injection  60 mg Subcutaneous Q24H  . famotidine  20 mg Oral BID  . fludrocortisone  0.1 mg Oral Daily  . fluticasone  2 spray Each Nare Daily  . ipratropium-albuterol  3 mL Nebulization Q6H  . levofloxacin  500 mg Oral Daily  . loratadine  10 mg Oral Daily  . methylPREDNISolone (SOLU-MEDROL) injection  60 mg Intravenous Q12H  . oxyCODONE-acetaminophen  1 tablet Oral 4 times per day   And  .  oxyCODONE  5 mg Oral 4 times per day  . predniSONE  40 mg Oral Q breakfast   Continuous:  ANV:[id number] **OR** acetaminophen, albuterol, alum & mag hydroxide-simeth, guaiFENesin-dextromethorphan, ondansetron **OR** ondansetron (ZOFRAN) IV  Assesment: He was admitted with acute respiratory failure with hypoxia. He was somnolent because of this. I think he has COPD but he has not had pulmonary function testing done yet. He is much improved and wants to go home.  His blood pressure is pretty well controlled and he has not had any chest pain. He has chronic low back pain which is stable he has Addison's disease and has been on increased dose of steroids Active Problems:   Essential hypertension   Coronary atherosclerosis   Backache   Fever   Acute respiratory failure with hypoxia   Acute bronchitis   Somnolence    Plan: Discharge home today    LOS: 2 days   Victor Nelson L 07/21/2014, 8:05 AM

## 2014-07-21 NOTE — Discharge Summary (Addendum)
Physician Discharge Summary  Patient ID: Victor Nelson MRN: [id number] DOB/AGE: 09-18-1945 69 y.o. Primary Care Physician:Ranika Mcniel L, MD Admit date: 07/19/2014 Discharge date: 07/21/2014    Discharge Diagnoses:   Active Problems:   Essential hypertension   Coronary atherosclerosis   Backache   Fever   Acute respiratory failure with hypoxia   Acute bronchitis   Somnolence   Addison's disease     Medication List    TAKE these medications        albuterol 108 (90 BASE) MCG/ACT inhaler  Commonly known as:  PROVENTIL HFA;VENTOLIN HFA  Inhale 2 puffs into the lungs every 6 (six) hours as needed for wheezing or shortness of breath.     aspirin EC 81 MG tablet  Take 81 mg by mouth daily.     fexofenadine 180 MG tablet  Commonly known as:  ALLEGRA  Take 180 mg by mouth daily.     fludrocortisone 0.1 MG tablet  Commonly known as:  FLORINEF  Take 0.1 mg by mouth daily.     fluticasone 50 MCG/ACT nasal spray  Commonly known as:  FLONASE  Place 2 sprays into both nostrils daily.     hydrocortisone 10 MG tablet  Commonly known as:  CORTEF  Take 20-40 mg by mouth 2 (two) times daily.     levofloxacin 500 MG tablet  Commonly known as:  LEVAQUIN  Take 1 tablet (500 mg total) by mouth daily.     methylPREDNIsolone 4 MG tablet  Commonly known as:  MEDROL DOSPACK  follow package directions     nitroGLYCERIN 0.4 MG SL tablet  Commonly known as:  NITROSTAT  Place 0.4 mg under the tongue every 5 (five) minutes as needed for chest pain.     oxyCODONE-acetaminophen 10-325 MG per tablet  Commonly known as:  PERCOCET  Take 1 tablet by mouth 4 (four) times daily.     ranitidine 150 MG tablet  Commonly known as:  ZANTAC  Take 150 mg by mouth daily.        Discharged Condition: Improved    Consults: None  Significant Diagnostic Studies: Dg Chest Port 1 View  07/19/2014   CLINICAL DATA:  Cough and shortness of Breath  EXAM: PORTABLE CHEST - 1 VIEW  COMPARISON:   None.  FINDINGS: Cardiac shadow is within normal limits. Postsurgical changes are seen. The lungs are well aerated bilaterally. Mild vascular congestion is seen. No focal confluent infiltrate is noted.  IMPRESSION: Mild vascular congestion.  No focal infiltrate is seen.   Electronically Signed   By: Alcide Clever M.D.   On: 07/19/2014 14:20    Lab Results: Basic Metabolic Panel:  Recent Labs  78/29/56 1310 07/20/14 0656  NA 132* 136  K 3.2* 3.9  CL 101 104  CO2 21 22  GLUCOSE 117* 209*  BUN 20 15  CREATININE 1.39* 1.06  CALCIUM 8.3* 8.6   Liver Function Tests:  Recent Labs  07/19/14 1310  AST 39*  ALT 42  ALKPHOS 66  BILITOT 0.9  PROT 6.9  ALBUMIN 4.1     CBC:  Recent Labs  07/19/14 1310 07/20/14 0656  WBC 7.6 10.8*  HGB 12.8* 12.6*  HCT 37.5* 37.2*  MCV 86.8 86.1  PLT 138* 148*    Recent Results (from the past 240 hour(s))  Blood culture (routine x 2)     Status: None (Preliminary result)   Collection Time: 07/19/14  1:07 PM  Result Value Ref Range Status   Specimen Description  RIGHT ANTECUBITAL  Final   Special Requests BOTTLES DRAWN AEROBIC ONLY 6CC  Final   Culture NO GROWTH 1 DAY  Final   Report Status PENDING  Incomplete  Blood culture (routine x 2)     Status: None (Preliminary result)   Collection Time: 07/19/14  4:21 PM  Result Value Ref Range Status   Specimen Description RIGHT ANTECUBITAL  Final   Special Requests BOTTLES DRAWN AEROBIC AND ANAEROBIC 6CC  Final   Culture NO GROWTH 1 DAY  Final   Report Status PENDING  Incomplete  MRSA PCR Screening     Status: None   Collection Time: 07/19/14  6:35 PM  Result Value Ref Range Status   MRSA by PCR NEGATIVE NEGATIVE Final    Comment:        The GeneXpert MRSA Assay (FDA approved for NASAL specimens only), is one component of a comprehensive MRSA colonization surveillance program. It is not intended to diagnose MRSA infection nor to guide or monitor treatment for MRSA infections.       Hospital Course: This is a 69 year old who had been in his usual state of good health when he developed fever cough congestion shortness of breath and he became very sleepy. He was brought to the emergency department where he was found to have acute respiratory failure and was felt to have acute bronchitis. I think he has COPD but he has not had a pulmonary function test yet so that diagnosis has not been established. He was started on IV antibiotics and because of his Addison's disease he was given IV steroids. He improved rapidly and was ready for discharge on the fourth.  Discharge Exam: Blood pressure 121/65, pulse 62, temperature 98.2 F (36.8 C), temperature source Oral, resp. rate 20, height 6\' 2"  (1.88 m), weight 124.013 kg (273 lb 6.4 oz), SpO2 93 %. He is awake and alert. No distress. His chest shows some rhonchi.  Disposition: Home. He will follow in my office in about 2 weeks he will have pulmonary function testing.      Discharge Instructions    Discharge patient    Complete by:  As directed              Signed: Solstice Lastinger L   07/21/2014, 8:08 AM

## 2014-07-24 LAB — CULTURE, BLOOD (ROUTINE X 2)
CULTURE: NO GROWTH
Culture: NO GROWTH

## 2014-07-31 ENCOUNTER — Other Ambulatory Visit (HOSPITAL_COMMUNITY): Payer: Self-pay | Admitting: Radiology

## 2014-07-31 DIAGNOSIS — J209 Acute bronchitis, unspecified: Secondary | ICD-10-CM

## 2014-08-08 ENCOUNTER — Ambulatory Visit (HOSPITAL_COMMUNITY)
Admission: RE | Admit: 2014-08-08 | Discharge: 2014-08-08 | Disposition: A | Discharge status: Home / Self Care | Payer: Medicare Other | Source: Ambulatory Visit | Attending: Pulmonary Disease | Admitting: Pulmonary Disease

## 2014-08-08 DIAGNOSIS — J209 Acute bronchitis, unspecified: Secondary | ICD-10-CM | POA: Insufficient documentation

## 2014-08-08 MED ORDER — ALBUTEROL SULFATE (2.5 MG/3ML) 0.083% IN NEBU
2.5000 mg | INHALATION_SOLUTION | Freq: Once | RESPIRATORY_TRACT | Status: AC
  Administered 2014-08-08: 2.5 mg via RESPIRATORY_TRACT

## 2014-08-10 LAB — PULMONARY FUNCTION TEST
DL/VA % pred: 81 %
DL/VA: 3.91 ml/min/mmHg/L
DLCO unc % pred: 63 %
DLCO unc: 23.93 ml/min/mmHg
FEF 25-75 POST: 1.71 L/s
FEF 25-75 Pre: 1.8 L/sec
FEF2575-%Change-Post: -5 %
FEF2575-%PRED-POST: 56 %
FEF2575-%PRED-PRE: 59 %
FEV1-%Change-Post: -3 %
FEV1-%Pred-Post: 64 %
FEV1-%Pred-Pre: 67 %
FEV1-POST: 2.53 L
FEV1-PRE: 2.63 L
FEV1FVC-%CHANGE-POST: 0 %
FEV1FVC-%PRED-PRE: 99 %
FEV6-%Change-Post: -3 %
FEV6-%Pred-Post: 68 %
FEV6-%Pred-Pre: 70 %
FEV6-Post: 3.42 L
FEV6-Pre: 3.53 L
FEV6FVC-%Change-Post: 0 %
FEV6FVC-%Pred-Post: 105 %
FEV6FVC-%Pred-Pre: 104 %
FVC-%CHANGE-POST: -4 %
FVC-%PRED-POST: 65 %
FVC-%Pred-Pre: 67 %
FVC-Post: 3.42 L
FVC-Pre: 3.57 L
PRE FEV1/FVC RATIO: 74 %
Post FEV1/FVC ratio: 74 %
Post FEV6/FVC ratio: 100 %
Pre FEV6/FVC Ratio: 99 %
RV % pred: 127 %
RV: 3.3 L
TLC % pred: 98 %
TLC: 7.71 L

## 2015-01-19 ENCOUNTER — Ambulatory Visit (INDEPENDENT_AMBULATORY_CARE_PROVIDER_SITE_OTHER): Payer: Medicare Other | Admitting: Nurse Practitioner

## 2015-01-19 VITALS — BP 131/74 | HR 63 | Temp 97.3°F | Ht 74.0 in | Wt 276.0 lb

## 2015-01-19 DIAGNOSIS — S41112A Laceration without foreign body of left upper arm, initial encounter: Secondary | ICD-10-CM | POA: Diagnosis not present

## 2015-01-19 NOTE — Progress Notes (Signed)
   Subjective:    Patient ID: Victor Nelson, male    DOB: May 19, 1945, 69 y.o.   MRN: [id number]  HPI Patient was helping his son in law remove a seat out if a car and fell backwards and cut his left forearm on counter top.     Review of Systems  Constitutional: Negative.   HENT: Negative.   Respiratory: Negative.   Cardiovascular: Negative.   Genitourinary: Negative.   Neurological: Negative.   Psychiatric/Behavioral: Negative.   All other systems reviewed and are negative.      Objective:   Physical Exam  Constitutional: He appears well-developed and well-nourished.  Cardiovascular: Normal rate, regular rhythm and normal heart sounds.   Pulmonary/Chest: Effort normal and breath sounds normal.  Skin: Skin is warm.  5cm laceration with smooth edges to left forearm.- motor strength and sensation distally intact   Verbal consent given by self. Local anesthesia Lidocaine 2% with 5ml Betadine prep 3-0 ethilon suture-6 simple interrupted stitches Cleaned with Saline Antibiotic ointment Dressing applied         Assessment & Plan:   1. Laceration of arm, left, initial encounter    Tetanus UTO Keep wound clean and dry Watch for signs of infection Motrin or tylenol OTC for pain RTO in 10 days for stitch removal  Mary-Margaret Daphine Deutscher, FNP

## 2015-01-19 NOTE — Patient Instructions (Signed)

## 2015-01-20 ENCOUNTER — Telehealth: Payer: Self-pay | Admitting: Nurse Practitioner

## 2015-01-20 NOTE — Telephone Encounter (Signed)
Pt given appt with MMM 10/13 at 8am for suture removal.

## 2015-01-29 ENCOUNTER — Encounter: Payer: Self-pay | Admitting: Nurse Practitioner

## 2015-01-29 ENCOUNTER — Ambulatory Visit (INDEPENDENT_AMBULATORY_CARE_PROVIDER_SITE_OTHER): Payer: Medicare Other | Admitting: Nurse Practitioner

## 2015-01-29 VITALS — BP 133/71 | HR 65 | Temp 97.1°F | Ht 74.0 in | Wt 283.0 lb

## 2015-01-29 DIAGNOSIS — Z4802 Encounter for removal of sutures: Secondary | ICD-10-CM

## 2015-01-29 NOTE — Progress Notes (Signed)
   Subjective:    Patient ID: Victor PieriniJames T Faxon, male    DOB: July 14, 1945, 69 y.o.   MRN: [id number]  HPI Patient here today for suture removal  He fail against a work bench in his so in laws garage on 01/19/15. Doing well - no c/o pain or distal numbness or tingling.    Review of Systems  Constitutional: Negative.   HENT: Negative.   Respiratory: Negative.   Cardiovascular: Negative.   Genitourinary: Negative.   Neurological: Negative.   Psychiatric/Behavioral: Negative.   All other systems reviewed and are negative.      Objective:   Physical Exam  Constitutional: He is oriented to person, place, and time. He appears well-developed and well-nourished.  Cardiovascular: Normal rate, regular rhythm and normal heart sounds.   Pulmonary/Chest: Effort normal and breath sounds normal.  Musculoskeletal: Normal range of motion.  Neurological: He is alert and oriented to person, place, and time.  Skin: Skin is warm and dry.  Wound edges left forearm well approximated - no erythema or edema.  Psychiatric: He has a normal mood and affect. His behavior is normal. Judgment and thought content normal.   BP 133/71 mmHg  Pulse 65  Temp(Src) 97.1 F (36.2 C) (Oral)  Ht 6\' 2"  (1.88 m)  Wt 283 lb (128.368 kg)  BMI 36.32 kg/m2  6 sutures removed- benzoin a steri strips applied     Assessment & Plan:   1. Visit for suture removal    Steri-strip care discussed Continue to watch for signs of infection RTO prn  Mary-Margaret Daphine DeutscherMartin, FNP

## 2015-01-29 NOTE — Patient Instructions (Signed)
Sterile Tape Wound Care °Some cuts and wounds can be closed using sterile tape, also called skin adhesive strips. Skin adhesive strips can be used for shallow (superficial) and simple cuts, wounds, lacerations, and surgical incisions. These strips act in place of stitches to hold the edges of the wound together, allowing for faster healing. Unlike stitches, the adhesive strips do not require needles or anesthetic medicine for placement. The strips will wear off naturally as the wound is healing. It is important to take proper care of your wound at home while it heals.  °HOME CARE INSTRUCTIONS °· Try to keep the area around your wound clean and dry. Do not allow the adhesive strips to get wet for the first 12 hours.   °· Do not use any soaps or ointments on the wound for the first 12 hours.   °· If a bandage (dressing) has been applied, follow your health care provider's instructions for how often to change the dressing. Keep the dressing dry if one has been applied.   °· Do not remove the adhesive strips. They will fall off on their own. If they do not, you may remove them gently after 10 days. You should gently wet the strips before removing them. For example, this can be done in the shower. °· Do not scratch, pick, or rub the wound area.   °· Protect the wound from further injury until it is healed.   °· Protect the wound from sun and tanning bed exposure while it is healing and for several weeks after healing.   °· Only take over-the-counter or prescription medicines as directed by your health care provider.   °· Keep all follow-up appointments as directed by your health care provider.   °SEEK MEDICAL CARE IF: °Your adhesive strips become wet or soaked with blood before the wound has healed. The tape will need to be replaced.  °SEEK IMMEDIATE MEDICAL CARE IF: °· You have increasing pain in the wound.   °· You develop a rash after the strips are applied. °· Your wound becomes red, swollen, hot, or tender.   °· You  have a red streak that goes away from the wound.   °· You have pus coming from the wound.   °· You have increased bleeding from the wound. °· You notice a bad smell coming from the wound.   °· Your wound breaks open. °MAKE SURE YOU: °· Understand these instructions. °· Will watch your condition. °· Will get help right away if you are not doing well or get worse. °  °This information is not intended to replace advice given to you by your health care provider. Make sure you discuss any questions you have with your health care provider. °  °Document Released: 05/12/2004 Document Revised: 04/25/2014 Document Reviewed: 10/24/2012 °Elsevier Interactive Patient Education ©2016 [employer] ° °

## 2016-01-26 DIAGNOSIS — E271 Primary adrenocortical insufficiency: Secondary | ICD-10-CM | POA: Diagnosis not present

## 2016-01-26 DIAGNOSIS — I2510 Atherosclerotic heart disease of native coronary artery without angina pectoris: Secondary | ICD-10-CM | POA: Diagnosis not present

## 2016-01-26 DIAGNOSIS — I10 Essential (primary) hypertension: Secondary | ICD-10-CM | POA: Diagnosis not present

## 2016-01-26 DIAGNOSIS — J449 Chronic obstructive pulmonary disease, unspecified: Secondary | ICD-10-CM | POA: Diagnosis not present

## 2016-04-28 DIAGNOSIS — I2510 Atherosclerotic heart disease of native coronary artery without angina pectoris: Secondary | ICD-10-CM | POA: Diagnosis not present

## 2016-04-28 DIAGNOSIS — I10 Essential (primary) hypertension: Secondary | ICD-10-CM | POA: Diagnosis not present

## 2016-04-28 DIAGNOSIS — M545 Low back pain: Secondary | ICD-10-CM | POA: Diagnosis not present

## 2016-04-28 DIAGNOSIS — J441 Chronic obstructive pulmonary disease with (acute) exacerbation: Secondary | ICD-10-CM | POA: Diagnosis not present

## 2016-07-27 DIAGNOSIS — I10 Essential (primary) hypertension: Secondary | ICD-10-CM | POA: Diagnosis not present

## 2016-07-27 DIAGNOSIS — J449 Chronic obstructive pulmonary disease, unspecified: Secondary | ICD-10-CM | POA: Diagnosis not present

## 2016-07-27 DIAGNOSIS — E271 Primary adrenocortical insufficiency: Secondary | ICD-10-CM | POA: Diagnosis not present

## 2016-07-27 DIAGNOSIS — J309 Allergic rhinitis, unspecified: Secondary | ICD-10-CM | POA: Diagnosis not present

## 2016-09-01 IMAGING — CR DG CHEST 1V PORT
1 series · 2 of 2 positions shown · non-contrast
Comparison: None.

CLINICAL DATA: Cough and shortness of Breath

EXAM:
PORTABLE CHEST - 1 VIEW

[Series 1: portable · 0.17mm/px · 2 of 2 slices shown]
[im 1/2]
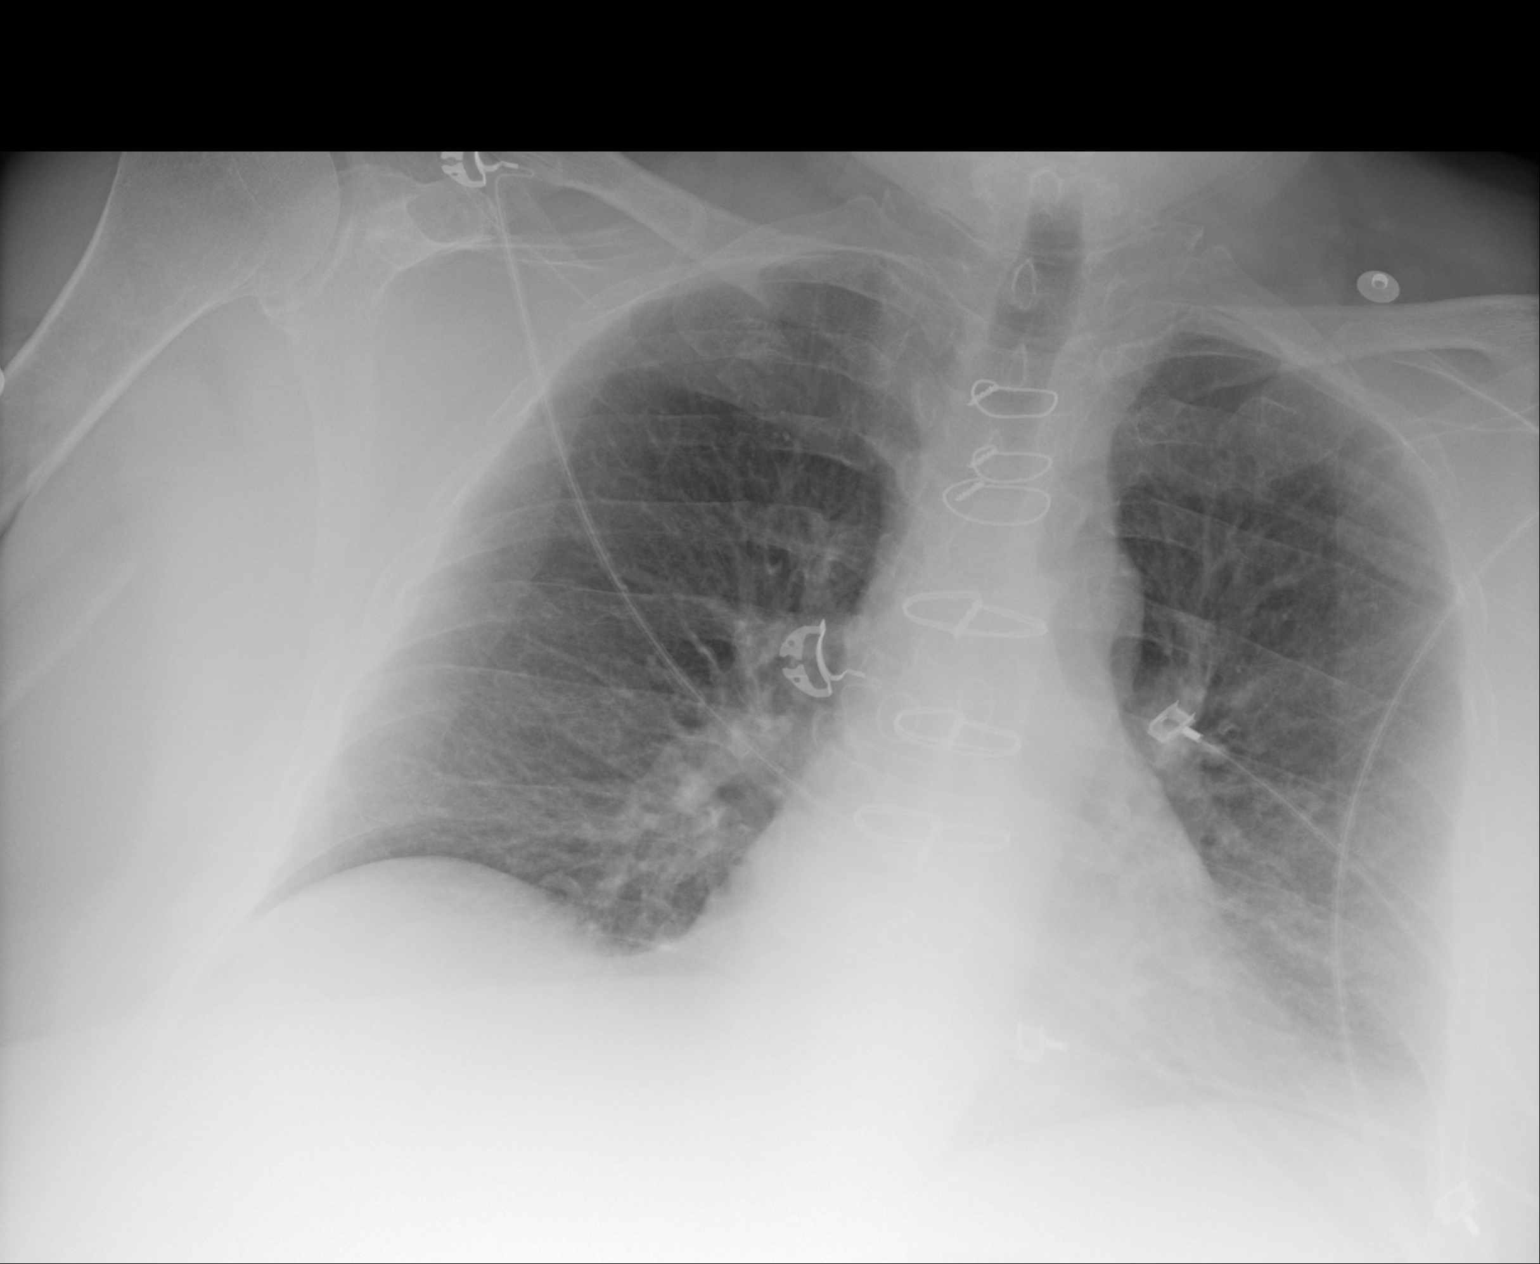
[im 2/2]
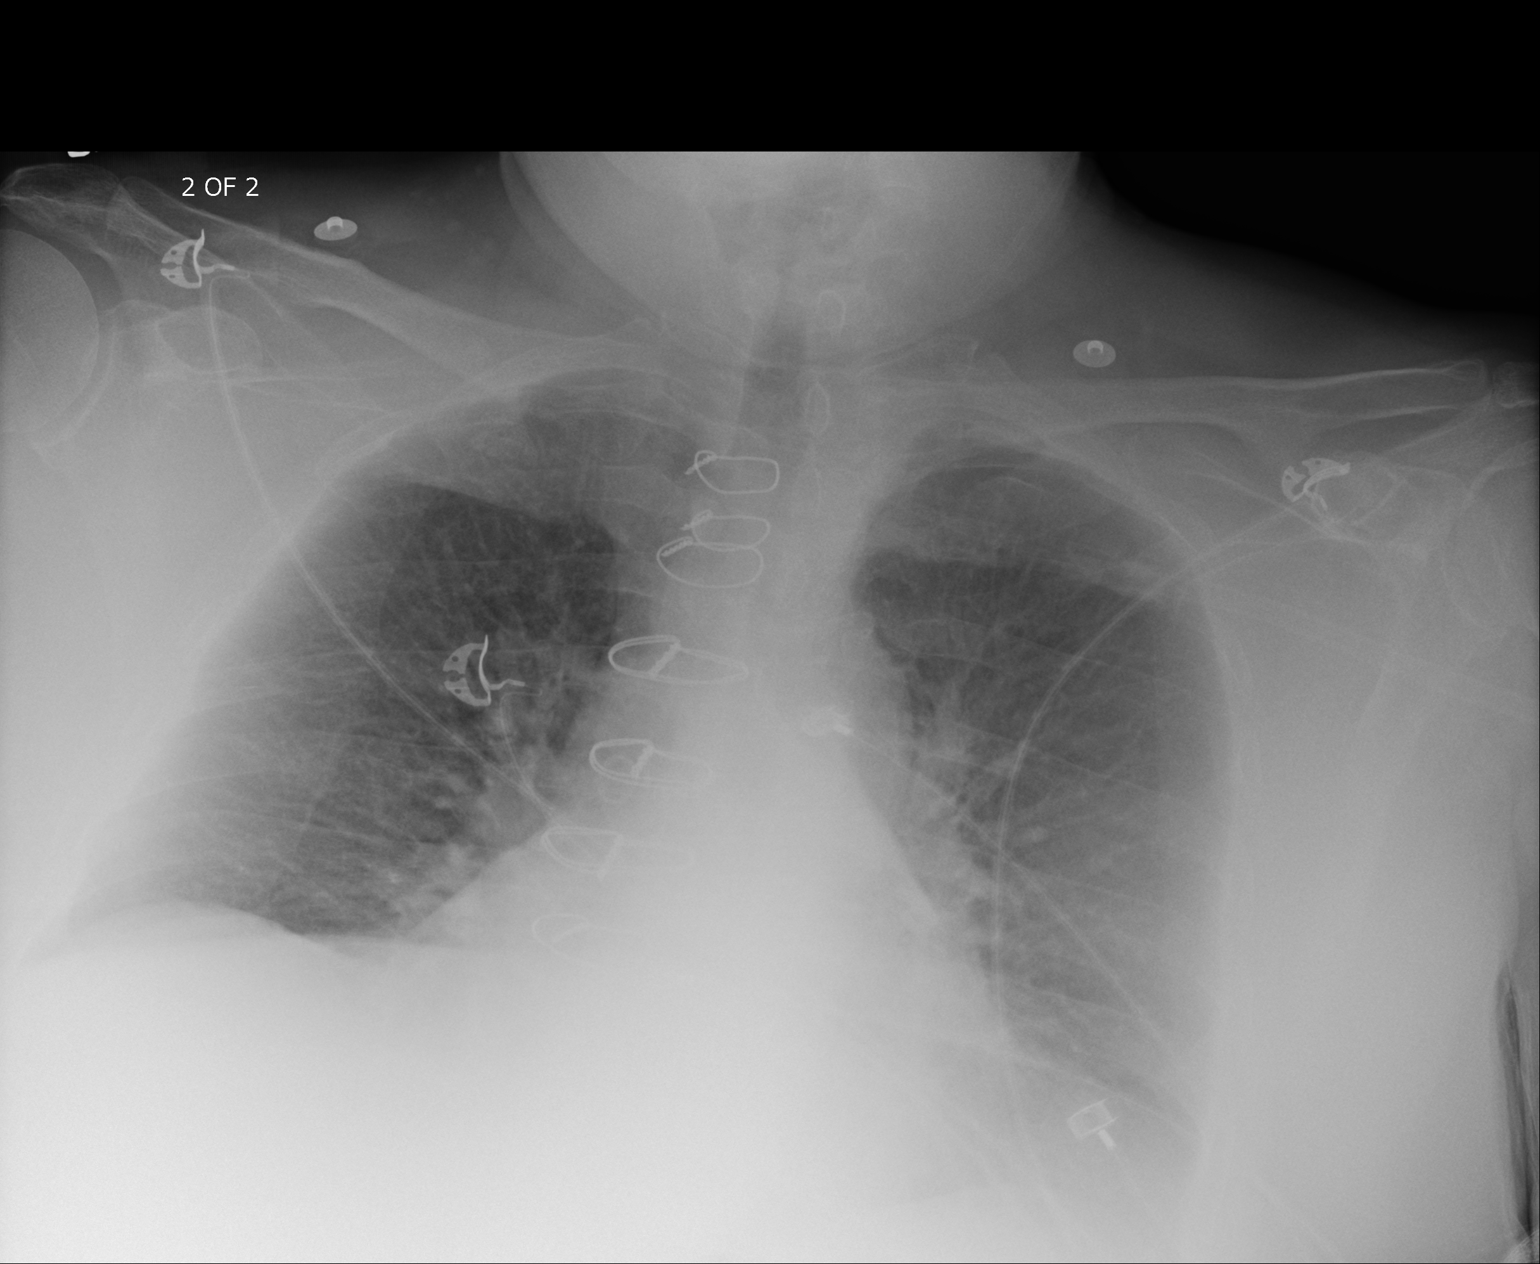

[2 of 2 positions shown; findings below may reference images not displayed]

FINDINGS: Cardiac shadow is within normal limits. Postsurgical changes are
seen. The lungs are well aerated bilaterally. Mild vascular
congestion is seen. No focal confluent infiltrate is noted.
IMPRESSION: Mild vascular congestion.  No focal infiltrate is seen.

## 2016-10-26 DIAGNOSIS — M545 Low back pain: Secondary | ICD-10-CM | POA: Diagnosis not present

## 2016-10-26 DIAGNOSIS — I10 Essential (primary) hypertension: Secondary | ICD-10-CM | POA: Diagnosis not present

## 2016-10-26 DIAGNOSIS — E271 Primary adrenocortical insufficiency: Secondary | ICD-10-CM | POA: Diagnosis not present

## 2016-10-26 DIAGNOSIS — J449 Chronic obstructive pulmonary disease, unspecified: Secondary | ICD-10-CM | POA: Diagnosis not present

## 2017-02-17 DIAGNOSIS — Z79891 Long term (current) use of opiate analgesic: Secondary | ICD-10-CM | POA: Diagnosis not present

## 2017-02-21 DIAGNOSIS — E271 Primary adrenocortical insufficiency: Secondary | ICD-10-CM | POA: Diagnosis not present

## 2017-02-21 DIAGNOSIS — I10 Essential (primary) hypertension: Secondary | ICD-10-CM | POA: Diagnosis not present

## 2017-02-21 DIAGNOSIS — J441 Chronic obstructive pulmonary disease with (acute) exacerbation: Secondary | ICD-10-CM | POA: Diagnosis not present

## 2017-02-21 DIAGNOSIS — I2510 Atherosclerotic heart disease of native coronary artery without angina pectoris: Secondary | ICD-10-CM | POA: Diagnosis not present

## 2017-03-16 DIAGNOSIS — I10 Essential (primary) hypertension: Secondary | ICD-10-CM | POA: Diagnosis not present

## 2017-03-16 DIAGNOSIS — J449 Chronic obstructive pulmonary disease, unspecified: Secondary | ICD-10-CM | POA: Diagnosis not present

## 2017-03-16 DIAGNOSIS — E271 Primary adrenocortical insufficiency: Secondary | ICD-10-CM | POA: Diagnosis not present

## 2017-03-16 DIAGNOSIS — I2510 Atherosclerotic heart disease of native coronary artery without angina pectoris: Secondary | ICD-10-CM | POA: Diagnosis not present

## 2017-05-19 DIAGNOSIS — J449 Chronic obstructive pulmonary disease, unspecified: Secondary | ICD-10-CM | POA: Diagnosis not present

## 2017-06-06 DIAGNOSIS — I10 Essential (primary) hypertension: Secondary | ICD-10-CM | POA: Diagnosis not present

## 2017-06-06 DIAGNOSIS — M545 Low back pain: Secondary | ICD-10-CM | POA: Diagnosis not present

## 2017-06-06 DIAGNOSIS — J441 Chronic obstructive pulmonary disease with (acute) exacerbation: Secondary | ICD-10-CM | POA: Diagnosis not present

## 2017-06-06 DIAGNOSIS — R252 Cramp and spasm: Secondary | ICD-10-CM | POA: Diagnosis not present

## 2017-06-07 DIAGNOSIS — I10 Essential (primary) hypertension: Secondary | ICD-10-CM | POA: Diagnosis not present

## 2017-06-07 DIAGNOSIS — M545 Low back pain: Secondary | ICD-10-CM | POA: Diagnosis not present

## 2017-06-07 DIAGNOSIS — R252 Cramp and spasm: Secondary | ICD-10-CM | POA: Diagnosis not present

## 2017-06-07 DIAGNOSIS — J441 Chronic obstructive pulmonary disease with (acute) exacerbation: Secondary | ICD-10-CM | POA: Diagnosis not present

## 2017-06-09 DIAGNOSIS — Z79899 Other long term (current) drug therapy: Secondary | ICD-10-CM | POA: Diagnosis not present

## 2017-06-09 DIAGNOSIS — M79672 Pain in left foot: Secondary | ICD-10-CM | POA: Diagnosis not present

## 2017-06-09 DIAGNOSIS — Z7982 Long term (current) use of aspirin: Secondary | ICD-10-CM | POA: Diagnosis not present

## 2017-06-09 DIAGNOSIS — M722 Plantar fascial fibromatosis: Secondary | ICD-10-CM | POA: Diagnosis not present

## 2017-06-09 DIAGNOSIS — F17200 Nicotine dependence, unspecified, uncomplicated: Secondary | ICD-10-CM | POA: Diagnosis not present

## 2017-08-05 DIAGNOSIS — M549 Dorsalgia, unspecified: Secondary | ICD-10-CM | POA: Diagnosis not present

## 2017-08-05 DIAGNOSIS — R140 Abdominal distension (gaseous): Secondary | ICD-10-CM | POA: Diagnosis not present

## 2017-08-05 DIAGNOSIS — K352 Acute appendicitis with generalized peritonitis, without abscess: Secondary | ICD-10-CM | POA: Diagnosis not present

## 2017-08-05 DIAGNOSIS — E271 Primary adrenocortical insufficiency: Secondary | ICD-10-CM | POA: Diagnosis not present

## 2017-08-05 DIAGNOSIS — Z79899 Other long term (current) drug therapy: Secondary | ICD-10-CM | POA: Diagnosis not present

## 2017-08-05 DIAGNOSIS — Z951 Presence of aortocoronary bypass graft: Secondary | ICD-10-CM | POA: Diagnosis not present

## 2017-08-05 DIAGNOSIS — R109 Unspecified abdominal pain: Secondary | ICD-10-CM | POA: Diagnosis not present

## 2017-08-05 DIAGNOSIS — R531 Weakness: Secondary | ICD-10-CM | POA: Diagnosis not present

## 2017-08-05 DIAGNOSIS — Z7952 Long term (current) use of systemic steroids: Secondary | ICD-10-CM | POA: Diagnosis not present

## 2017-08-05 DIAGNOSIS — Z6837 Body mass index (BMI) 37.0-37.9, adult: Secondary | ICD-10-CM | POA: Diagnosis not present

## 2017-08-05 DIAGNOSIS — Z791 Long term (current) use of non-steroidal anti-inflammatories (NSAID): Secondary | ICD-10-CM | POA: Diagnosis not present

## 2017-08-05 DIAGNOSIS — K567 Ileus, unspecified: Secondary | ICD-10-CM | POA: Diagnosis not present

## 2017-08-05 DIAGNOSIS — I2510 Atherosclerotic heart disease of native coronary artery without angina pectoris: Secondary | ICD-10-CM | POA: Diagnosis not present

## 2017-08-05 DIAGNOSIS — K3532 Acute appendicitis with perforation and localized peritonitis, without abscess: Secondary | ICD-10-CM | POA: Diagnosis not present

## 2017-08-05 DIAGNOSIS — F17200 Nicotine dependence, unspecified, uncomplicated: Secondary | ICD-10-CM | POA: Diagnosis not present

## 2017-08-05 DIAGNOSIS — Z9049 Acquired absence of other specified parts of digestive tract: Secondary | ICD-10-CM | POA: Diagnosis not present

## 2017-08-05 DIAGNOSIS — Z955 Presence of coronary angioplasty implant and graft: Secondary | ICD-10-CM | POA: Diagnosis not present

## 2017-08-05 DIAGNOSIS — E669 Obesity, unspecified: Secondary | ICD-10-CM | POA: Diagnosis not present

## 2017-08-05 DIAGNOSIS — G8929 Other chronic pain: Secondary | ICD-10-CM | POA: Diagnosis not present

## 2017-08-05 DIAGNOSIS — M6281 Muscle weakness (generalized): Secondary | ICD-10-CM | POA: Diagnosis not present

## 2017-08-05 DIAGNOSIS — Z7982 Long term (current) use of aspirin: Secondary | ICD-10-CM | POA: Diagnosis not present

## 2017-08-05 DIAGNOSIS — I252 Old myocardial infarction: Secondary | ICD-10-CM | POA: Diagnosis not present

## 2017-08-05 DIAGNOSIS — K3580 Unspecified acute appendicitis: Secondary | ICD-10-CM | POA: Diagnosis not present

## 2017-08-05 DIAGNOSIS — K2970 Gastritis, unspecified, without bleeding: Secondary | ICD-10-CM | POA: Diagnosis not present

## 2017-08-08 DIAGNOSIS — K567 Ileus, unspecified: Secondary | ICD-10-CM | POA: Diagnosis not present

## 2017-08-08 DIAGNOSIS — Z9049 Acquired absence of other specified parts of digestive tract: Secondary | ICD-10-CM | POA: Diagnosis not present

## 2017-08-09 DIAGNOSIS — K567 Ileus, unspecified: Secondary | ICD-10-CM | POA: Diagnosis not present

## 2017-08-09 DIAGNOSIS — M6281 Muscle weakness (generalized): Secondary | ICD-10-CM | POA: Diagnosis not present

## 2017-08-09 DIAGNOSIS — Z9049 Acquired absence of other specified parts of digestive tract: Secondary | ICD-10-CM | POA: Diagnosis not present

## 2017-08-10 DIAGNOSIS — K3580 Unspecified acute appendicitis: Secondary | ICD-10-CM | POA: Diagnosis not present

## 2017-09-04 DIAGNOSIS — M545 Low back pain: Secondary | ICD-10-CM | POA: Diagnosis not present

## 2017-09-04 DIAGNOSIS — R600 Localized edema: Secondary | ICD-10-CM | POA: Diagnosis not present

## 2017-09-04 DIAGNOSIS — I2510 Atherosclerotic heart disease of native coronary artery without angina pectoris: Secondary | ICD-10-CM | POA: Diagnosis not present

## 2017-09-04 DIAGNOSIS — I10 Essential (primary) hypertension: Secondary | ICD-10-CM | POA: Diagnosis not present

## 2017-09-07 ENCOUNTER — Other Ambulatory Visit (HOSPITAL_COMMUNITY): Payer: Self-pay | Admitting: Pulmonary Disease

## 2017-09-07 DIAGNOSIS — R609 Edema, unspecified: Secondary | ICD-10-CM

## 2017-09-08 ENCOUNTER — Ambulatory Visit (HOSPITAL_COMMUNITY)
Admission: RE | Admit: 2017-09-08 | Discharge: 2017-09-08 | Disposition: A | Discharge status: Home / Self Care | Payer: Medicare Other | Source: Ambulatory Visit | Attending: Nurse Practitioner | Admitting: Nurse Practitioner

## 2017-09-08 DIAGNOSIS — R001 Bradycardia, unspecified: Secondary | ICD-10-CM | POA: Insufficient documentation

## 2017-09-08 DIAGNOSIS — E7800 Pure hypercholesterolemia, unspecified: Secondary | ICD-10-CM | POA: Diagnosis not present

## 2017-09-08 DIAGNOSIS — I119 Hypertensive heart disease without heart failure: Secondary | ICD-10-CM | POA: Insufficient documentation

## 2017-09-08 DIAGNOSIS — J9601 Acute respiratory failure with hypoxia: Secondary | ICD-10-CM | POA: Insufficient documentation

## 2017-09-08 DIAGNOSIS — K219 Gastro-esophageal reflux disease without esophagitis: Secondary | ICD-10-CM | POA: Diagnosis not present

## 2017-09-08 DIAGNOSIS — R29898 Other symptoms and signs involving the musculoskeletal system: Secondary | ICD-10-CM | POA: Diagnosis not present

## 2017-09-08 DIAGNOSIS — R609 Edema, unspecified: Secondary | ICD-10-CM | POA: Diagnosis not present

## 2017-09-08 NOTE — Progress Notes (Signed)
*  PRELIMINARY RESULTS* Echocardiogram 2D Echocardiogram has been performed.  Stacey Drain 09/08/2017, 3:38 PM

## 2017-10-04 NOTE — Progress Notes (Signed)
Cardiology Office Note   Date:  10/04/2017   ID:  Victor PieriniJames T Cathers, DOB Aug 12, 1945, MRN [id number]  PCP:  Bennie PieriniMartin, Mary-Margaret, FNP  Cardiologist:   Charlton HawsPeter Naimah Yingst, MD   No chief complaint on file.     History of Present Illness: Victor Nelson is a 72 y.o. male who presents for consultation regarding abnormal echo Referred by Dr Juanetta GoslingHawkins  TTE done 09/08/17 reviewed Ordered for edema. EF 55% mild LVH Noted akinesis of mid apical anterior and septal walls and grade 2 diastolic dysfunction No significant valve disease and normal estimated PA pressure Reviewed DR Juanetta GoslingHawkins note 09/04/17 saw patient for pedal edema Had recent appendectomy Activity limited by chronic back pain. 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 He has had vein harvest for CABG with varicosities and obesity. No chest pain has exertional dyspnea Smoking 1 ppd   Past Medical History:  Diagnosis Date  . Addison's disease (HCC)   . Chronic back pain   . Coronary artery disease   . Dyspnea   . GERD (gastroesophageal reflux disease)   . Hypercholesterolemia   . Hypertension   . Myocardial infarct 2001    Past Surgical History:  Procedure Laterality Date  . BACK SURGERY    . CORONARY ANGIOPLASTY WITH STENT PLACEMENT  2001  . CORONARY ARTERY BYPASS GRAFT  2005     Current Outpatient Medications  Medication Sig Dispense Refill  . albuterol (PROVENTIL HFA;VENTOLIN HFA) 108 (90 BASE) MCG/ACT inhaler Inhale 2 puffs into the lungs every 6 (six) hours as needed for wheezing or shortness of breath. 1 Inhaler 2  . aspirin EC 81 MG tablet Take 81 mg by mouth daily.    . fexofenadine (ALLEGRA) 180 MG tablet Take 180 mg by mouth daily.    . fludrocortisone (FLORINEF) 0.1 MG tablet Take 0.1 mg by mouth daily.    .  fluticasone (FLONASE) 50 MCG/ACT nasal spray Place 2 sprays into both nostrils daily.    . hydrocortisone (CORTEF) 10 MG tablet Take 20-40 mg by mouth 2 (two) times daily.    Marland Kitchen. levofloxacin (LEVAQUIN) 500 MG tablet Take 1 tablet (500 mg total) by mouth daily. 7 tablet 0  . methylPREDNIsolone (MEDROL DOSPACK) 4 MG tablet follow package directions 21 tablet 0  . nitroGLYCERIN (NITROSTAT) 0.4 MG SL tablet Place 0.4 mg under the tongue every 5 (five) minutes as needed for chest pain.    Marland Kitchen. oxyCODONE-acetaminophen (PERCOCET) 10-325 MG per tablet Take 1 tablet by mouth 4 (four) times daily.    . ranitidine (ZANTAC) 150 MG tablet Take 150 mg by mouth daily.     No current facility-administered medications for this visit.     Allergies:   Patient has no known allergies.    Social History:  The patient  reports that he has been smoking cigarettes.  He has been smoking about 1.00 pack per day. He does not have any smokeless tobacco history on file. He reports that he does not drink alcohol or use drugs.   Family History:  The patient's family history is not on file.    ROS:  Please see the history of present illness.   Otherwise, review of systems are positive for none.   All  other systems are reviewed and negative.    PHYSICAL EXAM: VS:  There were no vitals taken for this visit. , BMI There is no height or weight on file to calculate BMI. Affect appropriate Healthy:  appears stated age HEENT: normal Neck supple with no adenopathy JVP normal no bruits no thyromegaly Lungs clear with no wheezing and good diaphragmatic motion Heart:  S1/S2 no murmur, no rub, gallop or click PMI normal Abdomen: benighn, BS positve, no tenderness, no AAA no bruit.  No HSM or HJR Distal pulses intact with no bruits No edema Neuro non-focal Skin warm and dry No muscular weakness    EKG:  07/21/14 SR nonspecific ST changes LAD    Recent Labs: No results found for requested labs within last 8760 hours.     Lipid Panel    Component Value Date/Time   CHOL 182 03/22/2007 1154   TRIG 293 (HH) 03/22/2007 1154   HDL 33.7 (L) 03/22/2007 1154   CHOLHDL 5.4 CALC 03/22/2007 1154   VLDL 59 (H) 03/22/2007 1154   LDLDIRECT 94.1 03/22/2007 1154      Wt Readings from Last 3 Encounters:  01/29/15 283 lb (128.4 kg)  01/19/15 276 lb (125.2 kg)  07/19/14 273 lb 6.4 oz (124 kg)      Other studies Reviewed: Additional studies/ records that were reviewed today include: Notes from Dr Juanetta Gosling TTE done May 24.    ASSESSMENT AND PLAN:  1.  CAD/CABG:  Distant echo likely represents old infarct given dyspnea and worsening edema will order lexiscan myovue to assess graft patency 2. 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 Addison's : on florinef and cortisone discussed stress dosing  4. Smoking:  Counseled on cessation for less than 10 minutes consider f/u primary for welbutrin script   Current medicines are reviewed at length with the patient today.  The patient does not have concerns regarding medicines.  The following changes have been made:  Lasix in afternoon   Labs/ tests ordered today include: Lexiscan Myovue  No orders of the defined types were placed in this encounter.   He has rhonchi and mild wheezing from smoking and COPD with resting HR in 60's so would not add beta Blocker for CAD. May need to add ACE/ARB pending results of myovue for decreased EF    Disposition:   FU with cardiology in a year if stress test ok     Signed, Charlton Haws, MD  10/04/2017 12:05 PM    [facility] [address] [place], [place], [place]  [address] Phone: [phone]; Fax: [phone]

## 2017-10-05 ENCOUNTER — Ambulatory Visit: Payer: Medicare Other | Admitting: Cardiovascular Disease

## 2017-10-05 ENCOUNTER — Encounter: Payer: Self-pay | Admitting: Cardiovascular Disease

## 2017-10-05 VITALS — BP 124/70 | HR 60 | Ht 74.0 in | Wt 270.2 lb

## 2017-10-05 DIAGNOSIS — I10 Essential (primary) hypertension: Secondary | ICD-10-CM | POA: Diagnosis not present

## 2017-10-05 DIAGNOSIS — I255 Ischemic cardiomyopathy: Secondary | ICD-10-CM | POA: Diagnosis not present

## 2017-10-05 NOTE — Patient Instructions (Signed)
Medication Instructions:  Your physician recommends that you continue on your current medications as directed. Please refer to the Current Medication list given to you today.   Labwork: none  Testing/Procedures: Your physician has requested that you have a lexiscan myoview. For further information please visit [email or web address]. Please follow instruction sheet, as given.    Follow-Up: Your physician recommends that you schedule a follow-up appointment in: as needed    Any Other Special Instructions Will Be Listed Below (If Applicable).     If you need a refill on your cardiac medications before your next appointment, please call your pharmacy.   

## 2017-10-09 ENCOUNTER — Encounter (HOSPITAL_COMMUNITY): Payer: Medicare Other

## 2017-10-25 DIAGNOSIS — E271 Primary adrenocortical insufficiency: Secondary | ICD-10-CM | POA: Diagnosis not present

## 2017-10-25 DIAGNOSIS — J441 Chronic obstructive pulmonary disease with (acute) exacerbation: Secondary | ICD-10-CM | POA: Diagnosis not present

## 2017-10-25 DIAGNOSIS — I2510 Atherosclerotic heart disease of native coronary artery without angina pectoris: Secondary | ICD-10-CM | POA: Diagnosis not present

## 2017-10-25 DIAGNOSIS — I10 Essential (primary) hypertension: Secondary | ICD-10-CM | POA: Diagnosis not present

## 2017-11-01 ENCOUNTER — Ambulatory Visit (HOSPITAL_COMMUNITY)
Admission: RE | Admit: 2017-11-01 | Discharge: 2017-11-01 | Disposition: A | Discharge status: Home / Self Care | Payer: Medicare Other | Source: Ambulatory Visit | Attending: Pulmonary Disease | Admitting: Pulmonary Disease

## 2017-11-01 ENCOUNTER — Encounter (HOSPITAL_COMMUNITY)
Admission: RE | Admit: 2017-11-01 | Discharge: 2017-11-01 | Disposition: A | Discharge status: Home / Self Care | Payer: Medicare Other | Source: Ambulatory Visit | Attending: Cardiovascular Disease | Admitting: Cardiovascular Disease

## 2017-11-01 ENCOUNTER — Encounter (HOSPITAL_COMMUNITY): Payer: Self-pay

## 2017-11-01 DIAGNOSIS — R9439 Abnormal result of other cardiovascular function study: Secondary | ICD-10-CM | POA: Insufficient documentation

## 2017-11-01 DIAGNOSIS — I255 Ischemic cardiomyopathy: Secondary | ICD-10-CM | POA: Insufficient documentation

## 2017-11-01 LAB — NM MYOCAR MULTI W/SPECT W/WALL MOTION / EF
LHR: 0.51
LV dias vol: 114 mL (ref 62–150)
LV sys vol: 63 mL
Peak HR: 72 {beats}/min
Rest HR: 53 {beats}/min
SDS: 4
SRS: 18
SSS: 22
TID: 1.18

## 2017-11-01 MED ORDER — REGADENOSON 0.4 MG/5ML IV SOLN
INTRAVENOUS | Status: AC
  Administered 2017-11-01: 0.4 mg via INTRAVENOUS
  Filled 2017-11-01: qty 5

## 2017-11-01 MED ORDER — TECHNETIUM TC 99M TETROFOSMIN IV KIT
30.0000 | PACK | Freq: Once | INTRAVENOUS | Status: AC | PRN
  Administered 2017-11-01: 30 via INTRAVENOUS

## 2017-11-01 MED ORDER — TECHNETIUM TC 99M TETROFOSMIN IV KIT
10.0000 | PACK | Freq: Once | INTRAVENOUS | Status: AC | PRN
  Administered 2017-11-01: 11 via INTRAVENOUS

## 2017-11-01 MED ORDER — SODIUM CHLORIDE 0.9% FLUSH
INTRAVENOUS | Status: AC
  Administered 2017-11-01: 10 mL via INTRAVENOUS
  Filled 2017-11-01: qty 10

## 2017-12-05 DIAGNOSIS — I10 Essential (primary) hypertension: Secondary | ICD-10-CM | POA: Diagnosis not present

## 2017-12-05 DIAGNOSIS — J449 Chronic obstructive pulmonary disease, unspecified: Secondary | ICD-10-CM | POA: Diagnosis not present

## 2017-12-05 DIAGNOSIS — Z79891 Long term (current) use of opiate analgesic: Secondary | ICD-10-CM | POA: Diagnosis not present

## 2017-12-05 DIAGNOSIS — I2510 Atherosclerotic heart disease of native coronary artery without angina pectoris: Secondary | ICD-10-CM | POA: Diagnosis not present

## 2017-12-05 DIAGNOSIS — I5032 Chronic diastolic (congestive) heart failure: Secondary | ICD-10-CM | POA: Diagnosis not present

## 2018-03-07 DIAGNOSIS — M545 Low back pain: Secondary | ICD-10-CM | POA: Diagnosis not present

## 2018-03-07 DIAGNOSIS — I2510 Atherosclerotic heart disease of native coronary artery without angina pectoris: Secondary | ICD-10-CM | POA: Diagnosis not present

## 2018-03-07 DIAGNOSIS — J449 Chronic obstructive pulmonary disease, unspecified: Secondary | ICD-10-CM | POA: Diagnosis not present

## 2018-03-07 DIAGNOSIS — I5032 Chronic diastolic (congestive) heart failure: Secondary | ICD-10-CM | POA: Diagnosis not present

## 2018-07-19 DIAGNOSIS — J449 Chronic obstructive pulmonary disease, unspecified: Secondary | ICD-10-CM | POA: Diagnosis not present

## 2018-07-19 DIAGNOSIS — I5032 Chronic diastolic (congestive) heart failure: Secondary | ICD-10-CM | POA: Diagnosis not present

## 2018-07-19 DIAGNOSIS — E271 Primary adrenocortical insufficiency: Secondary | ICD-10-CM | POA: Diagnosis not present

## 2018-07-19 DIAGNOSIS — I2510 Atherosclerotic heart disease of native coronary artery without angina pectoris: Secondary | ICD-10-CM | POA: Diagnosis not present

## 2019-01-21 DIAGNOSIS — M25562 Pain in left knee: Secondary | ICD-10-CM | POA: Diagnosis not present

## 2019-01-21 DIAGNOSIS — G8929 Other chronic pain: Secondary | ICD-10-CM | POA: Diagnosis not present

## 2019-01-21 DIAGNOSIS — M25561 Pain in right knee: Secondary | ICD-10-CM | POA: Diagnosis not present

## 2019-01-21 DIAGNOSIS — M545 Low back pain: Secondary | ICD-10-CM | POA: Diagnosis not present

## 2019-02-12 DIAGNOSIS — Z7952 Long term (current) use of systemic steroids: Secondary | ICD-10-CM | POA: Diagnosis not present

## 2019-02-19 DIAGNOSIS — G8929 Other chronic pain: Secondary | ICD-10-CM | POA: Diagnosis not present

## 2019-02-19 DIAGNOSIS — M79605 Pain in left leg: Secondary | ICD-10-CM | POA: Diagnosis not present

## 2019-02-19 DIAGNOSIS — M79604 Pain in right leg: Secondary | ICD-10-CM | POA: Diagnosis not present

## 2019-02-19 DIAGNOSIS — M545 Low back pain: Secondary | ICD-10-CM | POA: Diagnosis not present

## 2019-02-28 DIAGNOSIS — M79604 Pain in right leg: Secondary | ICD-10-CM | POA: Diagnosis not present

## 2019-02-28 DIAGNOSIS — Z136 Encounter for screening for cardiovascular disorders: Secondary | ICD-10-CM | POA: Diagnosis not present

## 2019-02-28 DIAGNOSIS — M79605 Pain in left leg: Secondary | ICD-10-CM | POA: Diagnosis not present

## 2019-03-21 DIAGNOSIS — M545 Low back pain: Secondary | ICD-10-CM | POA: Diagnosis not present

## 2019-03-21 DIAGNOSIS — Z87891 Personal history of nicotine dependence: Secondary | ICD-10-CM | POA: Diagnosis not present

## 2019-03-21 DIAGNOSIS — Z79899 Other long term (current) drug therapy: Secondary | ICD-10-CM | POA: Diagnosis not present

## 2019-03-21 DIAGNOSIS — G8929 Other chronic pain: Secondary | ICD-10-CM | POA: Diagnosis not present

## 2019-06-15 DIAGNOSIS — Z7952 Long term (current) use of systemic steroids: Secondary | ICD-10-CM | POA: Diagnosis not present

## 2019-06-15 DIAGNOSIS — R220 Localized swelling, mass and lump, head: Secondary | ICD-10-CM | POA: Diagnosis not present

## 2019-06-15 DIAGNOSIS — M545 Low back pain: Secondary | ICD-10-CM | POA: Diagnosis not present

## 2019-06-15 DIAGNOSIS — Z79899 Other long term (current) drug therapy: Secondary | ICD-10-CM | POA: Diagnosis not present

## 2019-06-27 DIAGNOSIS — L720 Epidermal cyst: Secondary | ICD-10-CM | POA: Diagnosis not present

## 2019-07-03 DIAGNOSIS — L720 Epidermal cyst: Secondary | ICD-10-CM | POA: Diagnosis not present

## 2019-07-09 DIAGNOSIS — L7211 Pilar cyst: Secondary | ICD-10-CM | POA: Diagnosis not present

## 2019-07-13 DIAGNOSIS — Z79899 Other long term (current) drug therapy: Secondary | ICD-10-CM | POA: Diagnosis not present

## 2019-07-13 DIAGNOSIS — F17210 Nicotine dependence, cigarettes, uncomplicated: Secondary | ICD-10-CM | POA: Diagnosis not present

## 2019-07-13 DIAGNOSIS — M545 Low back pain: Secondary | ICD-10-CM | POA: Diagnosis not present

## 2019-07-13 DIAGNOSIS — G8929 Other chronic pain: Secondary | ICD-10-CM | POA: Diagnosis not present

## 2019-07-13 DIAGNOSIS — Z7952 Long term (current) use of systemic steroids: Secondary | ICD-10-CM | POA: Diagnosis not present

## 2019-07-24 DIAGNOSIS — E7800 Pure hypercholesterolemia, unspecified: Secondary | ICD-10-CM | POA: Diagnosis not present

## 2019-07-24 DIAGNOSIS — K219 Gastro-esophageal reflux disease without esophagitis: Secondary | ICD-10-CM | POA: Diagnosis not present

## 2019-07-24 DIAGNOSIS — Z760 Encounter for issue of repeat prescription: Secondary | ICD-10-CM | POA: Diagnosis not present

## 2019-08-13 DIAGNOSIS — Z79899 Other long term (current) drug therapy: Secondary | ICD-10-CM | POA: Diagnosis not present

## 2019-08-13 DIAGNOSIS — M545 Low back pain: Secondary | ICD-10-CM | POA: Diagnosis not present

## 2019-08-13 DIAGNOSIS — G8929 Other chronic pain: Secondary | ICD-10-CM | POA: Diagnosis not present

## 2019-08-13 DIAGNOSIS — G629 Polyneuropathy, unspecified: Secondary | ICD-10-CM | POA: Diagnosis not present

## 2019-09-12 DIAGNOSIS — G8929 Other chronic pain: Secondary | ICD-10-CM | POA: Diagnosis not present

## 2019-09-12 DIAGNOSIS — M545 Low back pain: Secondary | ICD-10-CM | POA: Diagnosis not present

## 2019-09-12 DIAGNOSIS — Z79899 Other long term (current) drug therapy: Secondary | ICD-10-CM | POA: Diagnosis not present

## 2019-09-12 DIAGNOSIS — Z7952 Long term (current) use of systemic steroids: Secondary | ICD-10-CM | POA: Diagnosis not present

## 2019-10-10 DIAGNOSIS — Z7952 Long term (current) use of systemic steroids: Secondary | ICD-10-CM | POA: Diagnosis not present

## 2019-10-10 DIAGNOSIS — E271 Primary adrenocortical insufficiency: Secondary | ICD-10-CM | POA: Diagnosis not present

## 2019-10-10 DIAGNOSIS — M545 Low back pain: Secondary | ICD-10-CM | POA: Diagnosis not present

## 2019-10-10 DIAGNOSIS — G8929 Other chronic pain: Secondary | ICD-10-CM | POA: Diagnosis not present

## 2019-10-10 DIAGNOSIS — Z79899 Other long term (current) drug therapy: Secondary | ICD-10-CM | POA: Diagnosis not present

## 2019-11-07 DIAGNOSIS — Z79899 Other long term (current) drug therapy: Secondary | ICD-10-CM | POA: Diagnosis not present

## 2019-11-07 DIAGNOSIS — M545 Low back pain: Secondary | ICD-10-CM | POA: Diagnosis not present

## 2019-11-07 DIAGNOSIS — G8929 Other chronic pain: Secondary | ICD-10-CM | POA: Diagnosis not present

## 2019-11-07 DIAGNOSIS — E271 Primary adrenocortical insufficiency: Secondary | ICD-10-CM | POA: Diagnosis not present

## 2019-11-07 DIAGNOSIS — R0602 Shortness of breath: Secondary | ICD-10-CM | POA: Diagnosis not present

## 2019-12-16 IMAGING — NM NM MYOCAR MULTI W/SPECT W/WALL MOTION & EF
2 series · 12 of 12 positions shown · non-contrast
Comparison: none

[Series 1: rest · 6.51mm/px · 6 of 64 frames shown]
[frame 6/64]
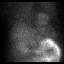
[frame 16/64]
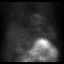
[frame 27/64]
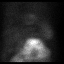
[frame 38/64]
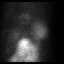
[frame 48/64]
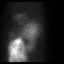
[frame 59/64]
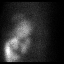

[Series 3: stress gated - perfusion · 6.51mm/px · 6 of 64 frames shown]
[frame 6/64]
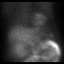
[frame 16/64]
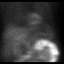
[frame 27/64]
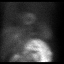
[frame 38/64]
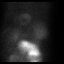
[frame 48/64]
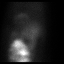
[frame 59/64]
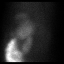

[12 of 12 positions shown; findings below may reference images not displayed]

Canned report from images found in remote index.

Refer to host system for actual result text.

## 2021-08-24 ENCOUNTER — Ambulatory Visit: Payer: Medicare HMO | Admitting: "Endocrinology

## 2021-08-24 ENCOUNTER — Encounter: Payer: Self-pay | Admitting: "Endocrinology

## 2021-08-24 VITALS — BP 108/58 | HR 72 | Ht 74.0 in | Wt 208.0 lb

## 2021-08-24 DIAGNOSIS — F17200 Nicotine dependence, unspecified, uncomplicated: Secondary | ICD-10-CM | POA: Insufficient documentation

## 2021-08-24 DIAGNOSIS — E2740 Unspecified adrenocortical insufficiency: Secondary | ICD-10-CM

## 2021-08-24 NOTE — Progress Notes (Signed)
? ?    Endocrinology Consult Note ?                                           08/24/2021, 7:07 PM ? ? ?Subjective:  ? ? Patient ID: Victor Nelson, male    DOB: 1946/01/06, PCP [name], NP ? ? ?Past Medical History:  ?Diagnosis Date  ? Addison's disease (HCC)   ? Chronic back pain   ? Coronary artery disease   ? Dyspnea   ? GERD (gastroesophageal reflux disease)   ? Hypercholesterolemia   ? Hypertension   ? Myocardial infarct [facility]) 2001  ? ?Past Surgical History:  ?Procedure Laterality Date  ? APPENDECTOMY    ? BACK SURGERY    ? CHOLECYSTECTOMY    ? CORONARY ANGIOPLASTY WITH STENT PLACEMENT  04/19/1999  ? CORONARY ARTERY BYPASS GRAFT  04/19/2003  ? ?Social History  ? ?Socioeconomic History  ? Marital status: Widowed  ?  Spouse name: Not on file  ? Number of children: Not on file  ? Years of education: Not on file  ? Highest education level: Not on file  ?Occupational History  ? Not on file  ?Tobacco Use  ? Smoking status: Every Day  ?  Packs/day: 1.00  ?  Types: Cigarettes  ? Smokeless tobacco: Never  ?Vaping Use  ? Vaping Use: Never used  ?Substance and Sexual Activity  ? Alcohol use: No  ? Drug use: No  ? Sexual activity: Not on file  ?Other Topics Concern  ? Not on file  ?Social History Narrative  ? Not on file  ? ?Social Determinants of Health  ? ?Financial Resource Strain: Not on file  ?Food Insecurity: Not on file  ?Transportation Needs: Not on file  ?Physical Activity: Not on file  ?Stress: Not on file  ?Social Connections: Not on file  ? ?Family History  ?Problem Relation Age of Onset  ? Liver disease Mother   ? Stroke Father   ? Addison's disease Brother   ? Diabetes Maternal Grandmother   ? Heart attack Maternal Grandfather   ? Heart attack Paternal Grandfather   ? Heart attack Brother   ? ?Outpatient Encounter Medications as of 08/24/2021  ?Medication Sig  ? albuterol (PROVENTIL HFA;VENTOLIN HFA) 108 (90 BASE) MCG/ACT inhaler Inhale 2 puffs into the lungs every 6 (six) hours as needed for wheezing  or shortness of breath.  ? aspirin EC 81 MG tablet Take 81 mg by mouth daily.  ? fexofenadine (ALLEGRA) 180 MG tablet Take 180 mg by mouth daily.  ? fludrocortisone (FLORINEF) 0.1 MG tablet Take 0.1 mg by mouth daily.  ? fluticasone (FLONASE) 50 MCG/ACT nasal spray Place 2 sprays into both nostrils daily.  ? furosemide (LASIX) 40 MG tablet Take 40 mg by mouth daily as needed.  ? gabapentin (NEURONTIN) 300 MG capsule Take 300-600 mg by mouth at bedtime.  ? hydrocortisone (CORTEF) 10 MG tablet Take 10 mg by mouth 2 (two) times daily.  ? nitroGLYCERIN (NITROSTAT) 0.4 MG SL tablet Place 0.4 mg under the tongue every 5 (five) minutes as needed for chest pain.  ? oxyCODONE-acetaminophen (PERCOCET) 10-325 MG per tablet Take 1 tablet by mouth 4 (four) times daily.  ? pantoprazole (PROTONIX) 40 MG tablet Take 40 mg by mouth daily.  ? potassium chloride SA (K-DUR,KLOR-CON) 20 MEQ tablet Take 1 tablet by mouth daily as needed.  ? [  DISCONTINUED] ranitidine (ZANTAC) 150 MG tablet Take 150 mg by mouth daily.  ? ?No facility-administered encounter medications on file as of 08/24/2021.  ? ?ALLERGIES: ?No Known Allergies ? ?VACCINATION STATUS: ? ?There is no immunization history on file for this patient. ? ?HPI ?Victor Nelson is 76 y.o. male who presents today with a medical history as above. he is being seen in consultation for adrenal insufficiency requested by Carmel Sacramentoerry, [place], NP.  ?Patient is accompanied by his grown daughter to clinic.  History is obtained directly from the family as well as chart review.  He reports that he was diagnosed with adrenal insufficiency at age of 76.  He has been on hydrocortisone and fludrocortisone for several decades following that.  Recently he was hospitalized and was found to have low cortisol preceded by his intake of excessive hydrocodone. ?He does not wear any medic alert typically has adrenal insufficiency.  He denies any history of adrenal injury or surgery.  He reports similar history in his  brother saying that his brother was born without adrenal gland. ?Reviewed multiple medical problems including coronary artery disease which required angioplasty, CHF, hyperlipidemia, chronic smoking, chronic pain syndrome on Percocet. ?He reports ongoing lower extremity edema , fluctuating body weight.  ?Prior to his recent hospitalization he was on hydrocortisone 10 mg p.o. twice daily, discharged on hydrocortisone 20 mg p.o. twice daily.  He noticed that his lower extremity edema has gotten worse on the higher dose of hydrocortisone. ?He denies nausea, vomiting, abdominal pain.  He has COPD on bronchodilators. ? ?Review of Systems ? ?Constitutional: + Fluctuating moderate, + fatigue , no subjective hyperthermia, no subjective hypothermia ?Eyes: no blurry vision, no xerophthalmia ?ENT: no sore throat, no nodules palpated in throat, no dysphagia/odynophagia, no hoarseness ?Cardiovascular: no Chest Pain, no Shortness of Breath, no palpitations, no leg swelling ?Respiratory: no cough, no shortness of breath ?Gastrointestinal: no Nausea/Vomiting/Diarhhea ?Musculoskeletal: no muscle/joint aches ?Skin: no rashes ?Neurological: no tremors, no numbness, no tingling, no dizziness ?Psychiatric: no depression, no anxiety ? ?Objective:  ?  ? ?  08/24/2021  ?  2:57 PM 10/05/2017  ? 10:33 AM 10/05/2017  ? 10:19 AM  ?Vitals with BMI  ?Height 6\' 2"   6\' 2"   ?Weight 208 lbs  270 lbs 3 oz  ?BMI 26.69  34.68  ?Systolic 108 124 [id number]  ?Diastolic 58 70 66  ?Pulse 72 60 58  ? ? ?BP (!) 108/58   Pulse 72   Ht 6\' 2"  (1.88 m)   Wt 208 lb (94.3 kg)   BMI 26.71 kg/m?   ?Wt Readings from Last 3 Encounters:  ?08/24/21 208 lb (94.3 kg)  ?10/05/17 270 lb 3.2 oz (122.6 kg)  ?01/29/15 283 lb (128.4 kg)  ?  ?Physical Exam ? ?Constitutional:  Body mass index is 26.71 kg/m?.,  not in acute distress, normal state of mind ?Eyes: PERRLA, EOMI, no exophthalmos ?ENT: moist mucous membranes, no gross thyromegaly, no gross cervical lymphadenopathy, +  edentulous . ?Cardiovascular: normal precordial activity, Regular Rate and Rhythm, no Murmur/Rubs/Gallops ?Respiratory:  adequate breathing efforts, no gross chest deformity, + poor air entry bilaterally.  Patient is on bronchodilators.  ?Gastrointestinal: abdomen soft, Non -tender, No distension, Bowel Sounds present, no gross organomegaly ?Musculoskeletal: no gross deformities, strength intact in all four extremities, + lateral dependent pedal/pretibial edema. ?Skin: moist, warm, no rashes ?Neurological: no tremor with outstretched hands, Deep tendon reflexes normal in bilateral lower extremities. ? ?CMP ( most recent) ?CMP  ?   ?Component Value Date/Time  ?  NA 136 07/20/2014 0656  ? K 3.9 07/20/2014 0656  ? CL 104 07/20/2014 0656  ? CO2 22 07/20/2014 0656  ? GLUCOSE 209 (H) 07/20/2014 0656  ? BUN 15 07/20/2014 0656  ? CREATININE 1.06 07/20/2014 0656  ? CALCIUM 8.6 07/20/2014 0656  ? PROT 6.9 07/19/2014 1310  ? ALBUMIN 4.1 07/19/2014 1310  ? AST 39 (H) 07/19/2014 1310  ? ALT 42 07/19/2014 1310  ? ALKPHOS 66 07/19/2014 1310  ? BILITOT 0.9 07/19/2014 1310  ? GFRNONAA 71 (L) 07/20/2014 0656  ? GFRAA 83 (L) 07/20/2014 0656  ? ? ? Lipid Panel ( most recent) ?Lipid Panel  ?   ?Component Value Date/Time  ? CHOL 182 03/22/2007 1154  ? TRIG 293 (HH) 03/22/2007 1154  ? HDL 33.7 (L) 03/22/2007 1154  ? CHOLHDL 5.4 CALC 03/22/2007 1154  ? VLDL 59 (H) 03/22/2007 1154  ? LDLDIRECT 94.1 03/22/2007 1154  ? ?  ? ?Lab Results  ?Component Value Date  ? TSH 2.41 03/22/2007  ? FREET4 0.5 (L) 03/22/2007  ?  ? ?Assessment & Plan:  ? ?1. Adrenal insufficiency (HCC) ? ?- Victor Pierini  is being seen at a kind request of [name], NP. ?- I have reviewed his available  records and clinically evaluated the patient. ?- Based on these reviews, he has longstanding adrenal insufficiency.  Etiology unclear, possible autoimmune given the fact that he was diagnosed as a teenager, complicated by his chronic opioid use related to his pain  syndrome. ? ?In any case, he will continue to need steroid replacement therapy. ?I discussed and lowered his hydrocortisone to 10 mg p.o. twice daily to minimize exposure to unnecessary steroids.  He is also advised to c

## 2021-08-30 NOTE — Progress Notes (Signed)
CARDIOLOGY CONSULT NOTE  ? ? ? ? ? ?Patient ID: ?BARI HANDSHOE ?MRN: [id number] ?DOB/AGE: [birth year] 74 y.o. ? ?Admit date: (Not on file) ?Referring Physician: Cecile Sheerer  ?Primary Physician: [name], NP ?Primary Cardiologist: New ?Reason for Consultation: CAD/CABG ? ?Active Problems: ?  * No active hospital problems. * ? ? ?HPI:  76 y.o. referred by Bearl Mulberry for history of CAD/CABG. He has not been seen by [place] since 2019. 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 COPD and uses inhalers Sees Nida Gebreselassie from Endocrine Does not appear to be on statin and no recorded allergies Gets more LE edema on higher dose steroids  ? ?Sent to [facility] doctor for unintentional weight loss She noted rapid HR and refused to do any procedures ?He is unaware of the rapid afib he is in office No chest pain Living with son and daughter in law Has had edema since hospital d/c No contraindications to anticoagulation  ? ?ROS ?All other systems reviewed and negative except as noted above ? ?Past Medical History:  ?Diagnosis Date  ? Addison's disease (HCC)   ? Chronic back pain   ? Coronary artery disease   ? Dyspnea   ? GERD (gastroesophageal reflux disease)   ? Hypercholesterolemia   ? Hypertension   ? Myocardial infarct [facility]) 2001  ?  ?Family History  ?Problem Relation Age of Onset  ? Liver disease Mother   ? Stroke Father   ? Addison's disease Brother   ? Diabetes Maternal Grandmother   ? Heart attack Maternal Grandfather   ? Heart attack Paternal Grandfather   ? Heart attack Brother   ?  ?Social History  ? ?Socioeconomic History  ? Marital status: Widowed  ?  Spouse name: Not on file  ? Number of children: Not on file  ? Years of  education: Not on file  ? Highest education level: Not on file  ?Occupational History  ? Not on file  ?Tobacco Use  ? Smoking status: Every Day  ?  Packs/day: 1.00  ?  Types: Cigarettes  ? Smokeless tobacco: Never  ?Vaping Use  ? Vaping Use: Never used  ?Substance and Sexual Activity  ? Alcohol use: No  ? Drug use: No  ? Sexual activity: Not on file  ?Other Topics Concern  ? Not on file  ?Social History Narrative  ? Not on file  ? ?Social Determinants of Health  ? ?Financial Resource Strain: Not on file  ?Food Insecurity: Not on file  ?Transportation Needs: Not on file  ?Physical Activity: Not on file  ?Stress: Not on file  ?Social Connections: Not on file  ?Intimate Partner Violence: Not on file  ?  ?Past Surgical History:  ?Procedure Laterality Date  ? APPENDECTOMY    ? BACK SURGERY    ? CHOLECYSTECTOMY    ? CORONARY ANGIOPLASTY WITH STENT PLACEMENT  04/19/1999  ? CORONARY ARTERY BYPASS GRAFT  04/19/2003  ?  ? ? ?Current Outpatient Medications:  ?  albuterol (PROVENTIL HFA;VENTOLIN HFA) 108 (90 BASE) MCG/ACT inhaler, Inhale 2 puffs into the lungs every 6 (six) hours as needed for wheezing or shortness of breath., Disp: 1 Inhaler, Rfl:  2 ?  aspirin EC 81 MG tablet, Take 81 mg by mouth daily., Disp: , Rfl:  ?  fexofenadine (ALLEGRA) 180 MG tablet, Take 180 mg by mouth daily., Disp: , Rfl:  ?  fludrocortisone (FLORINEF) 0.1 MG tablet, Take 0.1 mg by mouth daily., Disp: , Rfl:  ?  fluticasone (FLONASE) 50 MCG/ACT nasal spray, Place 2 sprays into both nostrils daily., Disp: , Rfl:  ?  furosemide (LASIX) 40 MG tablet, Take 40 mg by mouth daily as needed., Disp: , Rfl: 5 ?  gabapentin (NEURONTIN) 300 MG capsule, Take 300-600 mg by mouth at bedtime., Disp: , Rfl:  ?  hydrocortisone (CORTEF) 10 MG tablet, Take 10 mg by mouth 2 (two) times daily., Disp: , Rfl:  ?  nitroGLYCERIN (NITROSTAT) 0.4 MG SL tablet, Place 0.4 mg under the tongue every 5 (five) minutes as needed for chest pain., Disp: , Rfl:  ?   oxyCODONE-acetaminophen (PERCOCET) 10-325 MG per tablet, Take 1 tablet by mouth 4 (four) times daily., Disp: , Rfl:  ?  pantoprazole (PROTONIX) 40 MG tablet, Take 40 mg by mouth daily., Disp: , Rfl:  ?  potassium chloride SA (K-DUR,KLOR-CON) 20 MEQ tablet, Take 1 tablet by mouth daily as needed., Disp: , Rfl: 11 ? ? ? ?Physical Exam: ?There were no vitals taken for this visit.   ? ?Affect appropriate ?Healthy:  appears stated age ?HEENT: normal ?Neck supple with no adenopathy ?JVP normal no bruits no thyromegaly ?Lungs clear with no wheezing and good diaphragmatic motion ?Heart:  S1/S2 no murmur, no rub, gallop or click ?PMI normal post sternotomy  ?Abdomen: benighn, BS positve, no tenderness, no AAA ?no bruit.  No HSM or HJR post appendectomy and cholecystectomy  ?Distal pulses intact with no bruits post left radial harvest  ?Plus one bilateral edema ?Neuro non-focal ?Skin warm and dry ?No muscular weakness ? ? ?Labs: ?  ?Lab Results  ?Component Value Date  ? WBC 10.8 (H) 07/20/2014  ? HGB 12.6 (L) 07/20/2014  ? HCT 37.2 (L) 07/20/2014  ? MCV 86.1 07/20/2014  ? PLT 148 (L) 07/20/2014  ? No results for input(s): NA, K, CL, CO2, BUN, CREATININE, CALCIUM, PROT, BILITOT, ALKPHOS, ALT, AST, GLUCOSE in the last 168 hours. ? ?Invalid input(s): LABALBU ?Lab Results  ?Component Value Date  ? CKTOTAL 173 05/15/2007  ? TROPONINI <0.03 07/19/2014  ?  ?Lab Results  ?Component Value Date  ? CHOL 182 03/22/2007  ? ?Lab Results  ?Component Value Date  ? HDL 33.7 (L) 03/22/2007  ? ?No results found for: LDLCALC ?Lab Results  ?Component Value Date  ? TRIG 293 (HH) 03/22/2007  ? ?Lab Results  ?Component Value Date  ? CHOLHDL 5.4 CALC 03/22/2007  ? ?Lab Results  ?Component Value Date  ? LDLDIRECT 94.1 03/22/2007  ?  ?  ?Radiology: ?No results found. ? ?EKG: Afib rate 154 poor R wave progression LAD  ? ? ?ASSESSMENT AND PLAN:  ?CAD/CABG:  distant with two arterial grafts Appears that he may have had stents prior to CABG. No chest pain  will need risk stratification after afib taken care of  ?HLD:  needs labs not on statin  ?Addisons:  f/u endocrine continue cortef and florinef ?Smoking:  counseled on cessation < 10 minutes lung cancer screeing CT continue proventil ?Edema:  dependant with varicosities and obesity see below ?AFib:  rapid not clear of onset but likely couple months ago. No contraindications to anticoagulation Discussed rate control, conversion and stroke prevention  ? ?D/C lasix ?  Demedex 40 mg bid ?Lopressor 25 bid ?Eliquis 5 bid ?BMET/CBC/BNP ? ?F/U afib clinic 3 weeks arrange [facility] ?F/U with me 6-8 weeks will need echo and further evaluation of CAD after afib better  ? ?Signed: ?Charlton Haws ?08/30/2021, 5:51 PM ? ? ?

## 2021-09-01 ENCOUNTER — Encounter: Payer: Self-pay | Admitting: Cardiovascular Disease

## 2021-09-01 ENCOUNTER — Ambulatory Visit: Payer: Medicare HMO | Admitting: Cardiovascular Disease

## 2021-09-01 VITALS — BP 110/64 | HR 154 | Ht 74.0 in | Wt 201.8 lb

## 2021-09-01 DIAGNOSIS — E785 Hyperlipidemia, unspecified: Secondary | ICD-10-CM | POA: Diagnosis not present

## 2021-09-01 DIAGNOSIS — R609 Edema, unspecified: Secondary | ICD-10-CM

## 2021-09-01 DIAGNOSIS — I4891 Unspecified atrial fibrillation: Secondary | ICD-10-CM

## 2021-09-01 DIAGNOSIS — I25810 Atherosclerosis of coronary artery bypass graft(s) without angina pectoris: Secondary | ICD-10-CM

## 2021-09-01 MED ORDER — TORSEMIDE 40 MG PO TABS: 40.0000 mg | ORAL_TABLET | Freq: Two times a day (BID) | ORAL | 3 refills | Status: DC

## 2021-09-01 MED ORDER — APIXABAN 5 MG PO TABS: 5.0000 mg | ORAL_TABLET | Freq: Two times a day (BID) | ORAL | 11 refills | Status: AC

## 2021-09-01 MED ORDER — METOPROLOL TARTRATE 25 MG PO TABS: 25.0000 mg | ORAL_TABLET | Freq: Two times a day (BID) | ORAL | 3 refills | Status: DC

## 2021-09-01 NOTE — Patient Instructions (Addendum)
Medication Instructions:  ?Your physician has recommended you make the following change in your medication:  ?1-START metoprolol 25 mg by mouth twice daily ?2-START eliquis 5 mg by mouth twice daily ?3-START torsemide  40 mg by mouth twice daily ?4-STOP Furosemide ? ?*If you need a refill on your cardiac medications before your next appointment, please call your pharmacy* ? ?Lab Work: ?Your physician recommends that you have lab work today- BMET, BNP, and CBC ?If you have labs (blood work) drawn today and your tests are completely normal, you will receive your results only by: ?MyChart Message (if you have MyChart) OR ?A paper copy in the mail ?If you have any lab test that is abnormal or we need to change your treatment, we will call you to review the results. ? ? ?Follow-Up: ?At [facility], you and your health needs are our priority.  As part of our continuing mission to provide you with exceptional heart care, we have created designated Provider Care Teams.  These Care Teams include your primary Cardiologist (physician) and Advanced Practice Providers (APPs -  Physician Assistants and Nurse Practitioners) who all work together to provide you with the care you need, when you need it. ? ?We recommend signing up for the patient portal called "MyChart".  Sign up information is provided on this After Visit Summary.  MyChart is used to connect with patients for Virtual Visits (Telemedicine).  Patients are able to view lab/test results, encounter notes, upcoming appointments, etc.  Non-urgent messages can be sent to your provider as well.   ?To learn more about what you can do with MyChart, go to [email or web address].   ? ?Your next appointment:   ?8 weeks ? ?The format for your next appointment:   ?In Person ? ?Provider:   ?Charlton Haws, MD { ? ?You have been referred to [facility] need to see in 4 weeks. ? ?Important Information About Sugar ? ? ? ? ?  ?

## 2021-09-02 LAB — CBC WITH DIFFERENTIAL/PLATELET
Basophils Absolute: 0 10*3/uL (ref 0.0–0.2)
Basos: 0 %
EOS (ABSOLUTE): 0 10*3/uL (ref 0.0–0.4)
Eos: 0 %
Hematocrit: 36.4 % — ABNORMAL LOW (ref 37.5–51.0)
Hemoglobin: 12.3 g/dL — ABNORMAL LOW (ref 13.0–17.7)
Immature Grans (Abs): 0 10*3/uL (ref 0.0–0.1)
Immature Granulocytes: 0 %
Lymphocytes Absolute: 1.5 10*3/uL (ref 0.7–3.1)
Lymphs: 19 %
MCH: 29.9 pg (ref 26.6–33.0)
MCHC: 33.8 g/dL (ref 31.5–35.7)
MCV: 88 fL (ref 79–97)
Monocytes Absolute: 0.5 10*3/uL (ref 0.1–0.9)
Monocytes: 6 %
Neutrophils Absolute: 6 10*3/uL (ref 1.4–7.0)
Neutrophils: 75 %
Platelets: 201 10*3/uL (ref 150–450)
RBC: 4.12 x10E6/uL — ABNORMAL LOW (ref 4.14–5.80)
RDW: 14.9 % (ref 11.6–15.4)
WBC: 8 10*3/uL (ref 3.4–10.8)

## 2021-09-02 LAB — BASIC METABOLIC PANEL
BUN/Creatinine Ratio: 15 (ref 10–24)
BUN: 17 mg/dL (ref 8–27)
CO2: 24 mmol/L (ref 20–29)
Calcium: 9.2 mg/dL (ref 8.6–10.2)
Chloride: 98 mmol/L (ref 96–106)
Creatinine, Ser: 1.11 mg/dL (ref 0.76–1.27)
Glucose: 83 mg/dL (ref 70–99)
Potassium: 4.8 mmol/L (ref 3.5–5.2)
Sodium: 137 mmol/L (ref 134–144)
eGFR: 70 mL/min/{1.73_m2} (ref >59)

## 2021-09-02 LAB — PRO B NATRIURETIC PEPTIDE: NT-Pro BNP: 4836 pg/mL — ABNORMAL HIGH (ref 0–376)

## 2021-09-03 ENCOUNTER — Telehealth: Payer: Self-pay | Admitting: Cardiovascular Disease

## 2021-09-03 MED ORDER — NITROGLYCERIN 0.4 MG SL SUBL
0.4000 mg | SUBLINGUAL_TABLET | SUBLINGUAL | 3 refills | Status: AC | PRN
Start: 2021-09-03 — End: ?

## 2021-09-03 NOTE — Telephone Encounter (Signed)
*  STAT* If patient is at the pharmacy, call can be transferred to refill team.   1. Which medications need to be refilled? (please list name of each medication and dose if known) nitroGLYCERIN (NITROSTAT) 0.4 MG SL tablet  2. Which pharmacy/location (including street and city if local pharmacy) is medication to be sent to? [facility] [phone] - [place], [place] - [address] AT [facility] [address]  3. Do they need a 30 day or 90 day supply? 90

## 2021-09-03 NOTE — Telephone Encounter (Signed)
Refilled NTG 0.4 mg SL, #25,RF:3 to [facility]

## 2021-09-30 ENCOUNTER — Encounter (HOSPITAL_COMMUNITY): Payer: Self-pay | Admitting: Physician Assistant

## 2021-09-30 ENCOUNTER — Ambulatory Visit (HOSPITAL_COMMUNITY)
Admission: RE | Admit: 2021-09-30 | Discharge: 2021-09-30 | Disposition: A | Discharge status: Home / Self Care | Payer: Medicare HMO | Source: Ambulatory Visit | Attending: Physician Assistant | Admitting: Physician Assistant

## 2021-09-30 VITALS — BP 112/50 | HR 114 | Ht 74.0 in | Wt 198.2 lb

## 2021-09-30 DIAGNOSIS — I4819 Other persistent atrial fibrillation: Secondary | ICD-10-CM | POA: Diagnosis not present

## 2021-09-30 DIAGNOSIS — I10 Essential (primary) hypertension: Secondary | ICD-10-CM | POA: Diagnosis not present

## 2021-09-30 DIAGNOSIS — D6869 Other thrombophilia: Secondary | ICD-10-CM | POA: Diagnosis not present

## 2021-09-30 DIAGNOSIS — E271 Primary adrenocortical insufficiency: Secondary | ICD-10-CM | POA: Insufficient documentation

## 2021-09-30 DIAGNOSIS — J449 Chronic obstructive pulmonary disease, unspecified: Secondary | ICD-10-CM | POA: Diagnosis not present

## 2021-09-30 DIAGNOSIS — I2510 Atherosclerotic heart disease of native coronary artery without angina pectoris: Secondary | ICD-10-CM | POA: Diagnosis not present

## 2021-09-30 DIAGNOSIS — Z7901 Long term (current) use of anticoagulants: Secondary | ICD-10-CM | POA: Insufficient documentation

## 2021-09-30 DIAGNOSIS — E785 Hyperlipidemia, unspecified: Secondary | ICD-10-CM | POA: Diagnosis not present

## 2021-09-30 DIAGNOSIS — Z951 Presence of aortocoronary bypass graft: Secondary | ICD-10-CM | POA: Diagnosis not present

## 2021-09-30 DIAGNOSIS — F17210 Nicotine dependence, cigarettes, uncomplicated: Secondary | ICD-10-CM | POA: Diagnosis not present

## 2021-09-30 DIAGNOSIS — Z955 Presence of coronary angioplasty implant and graft: Secondary | ICD-10-CM | POA: Insufficient documentation

## 2021-09-30 DIAGNOSIS — Z79899 Other long term (current) drug therapy: Secondary | ICD-10-CM | POA: Diagnosis not present

## 2021-09-30 LAB — CBC
HCT: 38.3 % — ABNORMAL LOW (ref 39.0–52.0)
Hemoglobin: 12.6 g/dL — ABNORMAL LOW (ref 13.0–17.0)
MCH: 29 pg (ref 26.0–34.0)
MCHC: 32.9 g/dL (ref 30.0–36.0)
MCV: 88 fL (ref 80.0–100.0)
Platelets: 186 10*3/uL (ref 150–400)
RBC: 4.35 MIL/uL (ref 4.22–5.81)
RDW: 14.4 % (ref 11.5–15.5)
WBC: 6.5 10*3/uL (ref 4.0–10.5)
nRBC: 0 % (ref 0.0–0.2)

## 2021-09-30 LAB — BASIC METABOLIC PANEL
Anion gap: 9 (ref 5–15)
BUN: 19 mg/dL (ref 8–23)
CO2: 29 mmol/L (ref 22–32)
Calcium: 9 mg/dL (ref 8.9–10.3)
Chloride: 102 mmol/L (ref 98–111)
Creatinine, Ser: 1.35 mg/dL — ABNORMAL HIGH (ref 0.61–1.24)
GFR, Estimated: 55 mL/min — ABNORMAL LOW (ref >60)
Glucose, Bld: 102 mg/dL — ABNORMAL HIGH (ref 70–99)
Potassium: 4.1 mmol/L (ref 3.5–5.1)
Sodium: 140 mmol/L (ref 135–145)

## 2021-09-30 NOTE — Patient Instructions (Signed)
Cardioversion scheduled for Tuesday, June 27th  - Arrive at the [facility] and go to admitting at 930am  - Do not eat or drink anything after midnight the night prior to your procedure.  - Take all your morning medication (except diabetic medications) with a sip of water prior to arrival.  - You will not be able to drive home after your procedure.  - Do NOT miss any doses of your blood thinner - if you should miss a dose please notify our office immediately.  - If you feel as if you go back into normal rhythm prior to scheduled cardioversion, please notify our office immediately. If your procedure is canceled in the cardioversion suite you will be charged a cancellation fee.

## 2021-09-30 NOTE — Progress Notes (Signed)
Primary Care Physician: [name], NP Primary Cardiologist: Dr Eden Emms Primary Electrophysiologist: none Referring Physician: Dr Iven Finn Victor Nelson Victor Nelson is a 76 y.o. male with a history of CAD s/p CABG, Addison's disease COPD, tobacco abuse, HTN, HLD, chronic back pain, atrial fibrillation who presents for consultation in the [facility].  The patient was initially diagnosed with atrial fibrillation 09/01/21. He was sent to Dr Eden Emms after patient was found to have an elevated heart rate at a different provider office. He did not have any awareness of his afib at that time. ECG showed arib with HR 154 bpm. He was started on metoprolol for rate control and Eliquis for a CHADS2VASC score of 3. Patient reports that he has done well since then, still no awareness of his arrhythmia. His heart rate is better controlled. No bleeding issues on anticoagulation.   Today, he denies symptoms of palpitations, chest pain, shortness of breath, orthopnea, PND, lower extremity edema, dizziness, presyncope, syncope, snoring, daytime somnolence, bleeding, or neurologic sequela. The patient is tolerating medications without difficulties and is otherwise without complaint today.    Atrial Fibrillation Risk Factors:  he does not have symptoms or diagnosis of sleep apnea. he does not have a history of rheumatic fever.   he has a BMI of Body mass index is 25.45 kg/m.Marland Kitchen Filed Weights   09/30/21 1338  Weight: 89.9 kg    Family History  Problem Relation Age of Onset   Liver disease Mother    Stroke Father    Addison's disease Brother    Diabetes Maternal Grandmother    Heart attack Maternal Grandfather    Heart attack Paternal Grandfather    Heart attack Brother      Atrial Fibrillation Management history:  Previous antiarrhythmic drugs: none Previous cardioversions: none Previous ablations: none CHADS2VASC score: 3 Anticoagulation history: Eliquis   Past Medical  History:  Diagnosis Date   Addison's disease (HCC)    Chronic back pain    Coronary artery disease    Dyspnea    GERD (gastroesophageal reflux disease)    Hypercholesterolemia    Hypertension    Myocardial infarct (HCC) 2001   Past Surgical History:  Procedure Laterality Date   APPENDECTOMY     BACK SURGERY     CHOLECYSTECTOMY     CORONARY ANGIOPLASTY WITH STENT PLACEMENT  04/19/1999   CORONARY ARTERY BYPASS GRAFT  04/19/2003    Current Outpatient Medications  Medication Sig Dispense Refill   albuterol (PROVENTIL HFA;VENTOLIN HFA) 108 (90 BASE) MCG/ACT inhaler Inhale 2 puffs into the lungs every 6 (six) hours as needed for wheezing or shortness of breath. 1 Inhaler 2   apixaban (ELIQUIS) 5 MG TABS tablet Take 1 tablet (5 mg total) by mouth 2 (two) times daily. 60 tablet 11   aspirin EC 81 MG tablet Take 81 mg by mouth daily.     fexofenadine (ALLEGRA) 180 MG tablet Take 180 mg by mouth daily.     fludrocortisone (FLORINEF) 0.1 MG tablet Take 0.1 mg by mouth daily.     fluticasone (FLONASE) 50 MCG/ACT nasal spray Place 2 sprays into both nostrils daily.     gabapentin (NEURONTIN) 300 MG capsule Take 300-600 mg by mouth at bedtime.     hydrocortisone (CORTEF) 10 MG tablet Take 10 mg by mouth 2 (two) times daily.     metoprolol tartrate (LOPRESSOR) 25 MG tablet Take 1 tablet (25 mg total) by mouth 2 (two) times daily. 180 tablet 3  naloxone (NARCAN) nasal spray 4 mg/0.1 mL SMARTSIG:Both Nares     nitroGLYCERIN (NITROSTAT) 0.4 MG SL tablet Place 1 tablet (0.4 mg total) under the tongue every 5 (five) minutes as needed for chest pain. 25 tablet 3   oxyCODONE-acetaminophen (PERCOCET) 10-325 MG per tablet Take 1 tablet by mouth 4 (four) times daily.     pantoprazole (PROTONIX) 40 MG tablet Take 40 mg by mouth daily.     potassium chloride SA (K-DUR,KLOR-CON) 20 MEQ tablet Take 1 tablet by mouth daily as needed.  11   torsemide (DEMADEX) 20 MG tablet Take 40 mg by mouth 2 (two) times  daily.     No current facility-administered medications for this encounter.    No Known Allergies  Social History   Socioeconomic History   Marital status: Widowed    Spouse name: Not on file   Number of children: Not on file   Years of education: Not on file   Highest education level: Not on file  Occupational History   Not on file  Tobacco Use   Smoking status: Every Day    Packs/day: 1.00    Types: Cigarettes   Smokeless tobacco: Never   Tobacco comments:    Former smoker 09/30/21  Vaping Use   Vaping Use: Never used  Substance and Sexual Activity   Alcohol use: No   Drug use: No   Sexual activity: Not on file  Other Topics Concern   Not on file  Social History Narrative   Not on file   Social Determinants of Health   Financial Resource Strain: Not on file  Food Insecurity: Not on file  Transportation Needs: Not on file  Physical Activity: Not on file  Stress: Not on file  Social Connections: Not on file  Intimate Partner Violence: Not on file     ROS- All systems are reviewed and negative except as per the HPI above.  Physical Exam: Vitals:   09/30/21 1338  BP: (!) 112/50  Pulse: (!) 114  Weight: 89.9 kg  Height: 6\' 2"  (1.88 m)    GEN- The patient is a well appearing male, alert and oriented x 3 today.   Head- normocephalic, atraumatic Eyes-  Sclera clear, conjunctiva pink Ears- hearing intact Oropharynx- clear Neck- supple  Lungs- Clear to ausculation bilaterally, normal work of breathing Heart- irregular rate and rhythm, no murmurs, rubs or gallops  GI- soft, NT, ND, + BS Extremities- no clubbing, cyanosis, or edema MS- no significant deformity or atrophy Skin- no rash or lesion Psych- euthymic mood, full affect Neuro- strength and sensation are intact  Wt Readings from Last 3 Encounters:  09/30/21 89.9 kg  09/01/21 91.5 kg  08/24/21 94.3 kg    EKG today demonstrates  Afib with RVR Vent. rate 114 BPM PR interval * ms QRS  duration 84 ms QT/QTcB 358/493 ms  Epic records are reviewed at length today  CHA2DS2-VASc Score = 3  The patient's score is based upon: CHF History: 0 HTN History: 1 Diabetes History: 0 Stroke History: 0 Vascular Disease History: 1 Age Score: 1 Gender Score: 0       ASSESSMENT AND PLAN: 1. Persistent Atrial Fibrillation (ICD10:  I48.19) The patient's CHA2DS2-VASc score is 3, indicating a 3.2% annual risk of stroke.   General education about afib provided and questions answered. We also discussed his stroke risk and the risks and benefits of anticoagulation. Heart rate improved after sitting in exam room, will not increase rate control at this time.  Patient has been on anticoagulation >3 weeks without missed doses, will arrange for DCCV.  Continue Lopressor 25 mg BID Continue Eliquis 5 mg BID  2. Secondary Hypercoagulable State (ICD10:  D68.69) The patient is at significant risk for stroke/thromboembolism based upon his CHA2DS2-VASc Score of 3.  Continue Apixaban (Eliquis).   3. CAD S/p CABG and stenting. No anginal symptoms.    Follow up with Dr Eden Emms as scheduled.    Jorja Loa PA-C Afib Clinic [facility] [address] [place], [place] [address] [phone] 09/30/2021 1:47 PM

## 2021-10-06 ENCOUNTER — Encounter (HOSPITAL_COMMUNITY): Payer: Self-pay | Admitting: Internal Medicine

## 2021-10-12 ENCOUNTER — Encounter (HOSPITAL_COMMUNITY): Payer: Self-pay | Admitting: Internal Medicine

## 2021-10-12 ENCOUNTER — Ambulatory Visit (HOSPITAL_COMMUNITY): Payer: Medicare HMO | Admitting: Anesthesiology

## 2021-10-12 ENCOUNTER — Ambulatory Visit (HOSPITAL_BASED_OUTPATIENT_CLINIC_OR_DEPARTMENT_OTHER): Payer: Medicare HMO | Admitting: Anesthesiology

## 2021-10-12 ENCOUNTER — Ambulatory Visit (HOSPITAL_COMMUNITY)
Admission: RE | Admit: 2021-10-12 | Discharge: 2021-10-12 | Disposition: A | Discharge status: Home / Self Care | Payer: Medicare HMO | Source: Ambulatory Visit | Attending: Internal Medicine | Admitting: Internal Medicine

## 2021-10-12 ENCOUNTER — Other Ambulatory Visit: Payer: Self-pay

## 2021-10-12 ENCOUNTER — Encounter (HOSPITAL_COMMUNITY)
Admission: RE | Disposition: A | Discharge status: Home / Self Care | Payer: Self-pay | Source: Ambulatory Visit | Attending: Internal Medicine

## 2021-10-12 DIAGNOSIS — I10 Essential (primary) hypertension: Secondary | ICD-10-CM | POA: Insufficient documentation

## 2021-10-12 DIAGNOSIS — I2510 Atherosclerotic heart disease of native coronary artery without angina pectoris: Secondary | ICD-10-CM

## 2021-10-12 DIAGNOSIS — E785 Hyperlipidemia, unspecified: Secondary | ICD-10-CM | POA: Diagnosis not present

## 2021-10-12 DIAGNOSIS — E271 Primary adrenocortical insufficiency: Secondary | ICD-10-CM | POA: Diagnosis not present

## 2021-10-12 DIAGNOSIS — Z951 Presence of aortocoronary bypass graft: Secondary | ICD-10-CM | POA: Diagnosis not present

## 2021-10-12 DIAGNOSIS — G8929 Other chronic pain: Secondary | ICD-10-CM | POA: Diagnosis not present

## 2021-10-12 DIAGNOSIS — F17210 Nicotine dependence, cigarettes, uncomplicated: Secondary | ICD-10-CM | POA: Insufficient documentation

## 2021-10-12 DIAGNOSIS — D649 Anemia, unspecified: Secondary | ICD-10-CM | POA: Diagnosis not present

## 2021-10-12 DIAGNOSIS — J449 Chronic obstructive pulmonary disease, unspecified: Secondary | ICD-10-CM | POA: Insufficient documentation

## 2021-10-12 DIAGNOSIS — I252 Old myocardial infarction: Secondary | ICD-10-CM

## 2021-10-12 DIAGNOSIS — I4819 Other persistent atrial fibrillation: Secondary | ICD-10-CM | POA: Diagnosis present

## 2021-10-12 DIAGNOSIS — D759 Disease of blood and blood-forming organs, unspecified: Secondary | ICD-10-CM | POA: Diagnosis not present

## 2021-10-12 DIAGNOSIS — D6869 Other thrombophilia: Secondary | ICD-10-CM | POA: Diagnosis not present

## 2021-10-12 DIAGNOSIS — K219 Gastro-esophageal reflux disease without esophagitis: Secondary | ICD-10-CM | POA: Diagnosis not present

## 2021-10-12 DIAGNOSIS — I4891 Unspecified atrial fibrillation: Secondary | ICD-10-CM | POA: Diagnosis not present

## 2021-10-12 DIAGNOSIS — Z7901 Long term (current) use of anticoagulants: Secondary | ICD-10-CM | POA: Insufficient documentation

## 2021-10-12 HISTORY — PX: CARDIOVERSION: SHX1299

## 2021-10-12 SURGERY — CARDIOVERSION
Anesthesia: General

## 2021-10-12 MED ORDER — PROPOFOL 10 MG/ML IV BOLUS
INTRAVENOUS | Status: DC | PRN
  Administered 2021-10-12: 50 mg via INTRAVENOUS
  Administered 2021-10-12: 15 mg via INTRAVENOUS

## 2021-10-12 MED ORDER — SODIUM CHLORIDE 0.9 % IV SOLN: INTRAVENOUS | Status: DC

## 2021-10-12 MED ORDER — LIDOCAINE 2% (20 MG/ML) 5 ML SYRINGE
INTRAMUSCULAR | Status: DC | PRN
  Administered 2021-10-12: 60 mg via INTRAVENOUS

## 2021-10-13 ENCOUNTER — Encounter (HOSPITAL_COMMUNITY): Payer: Self-pay | Admitting: Internal Medicine

## 2021-10-15 NOTE — Progress Notes (Signed)
CARDIOLOGY CONSULT NOTE       Patient ID: Victor Nelson MRN: [id number] DOB/AGE: [birth year] 74 y.o.  Admit date: (Not on file) Referring Physician: Renelda Loma  Primary Physician: Emelia Loron, NP Primary Cardiologist: New Reason for Consultation: CAD/CABG  Active Problems:   * No active hospital problems. *   HPI:  76 y.o.  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 COPD and uses inhalers Sees Loni Beckwith from Endocrine Does not appear to be on statin and no recorded allergies Gets more LE edema on higher dose steroids   GI doctor noted rapid HR in May 2023 was being w/u for unintentional weight loss noted to be volume overloaded and in afib Started on eliquis , lopressor and demedex  Had successful [facility] on 10/12/21 with Dr Debara Pickett   ROS All other systems reviewed and negative except as noted above  Past Medical History:  Diagnosis Date  . Addison's disease ([place])   . Chronic back pain   . Coronary artery disease   . Dyspnea   . GERD (gastroesophageal reflux disease)   . Hypercholesterolemia   . Hypertension   . Myocardial infarct [facility]) 2001    Family History  Problem Relation Age of Onset  . Liver disease Mother   . Stroke Father   . Addison's disease Brother   . Diabetes Maternal Grandmother   . Heart attack Maternal Grandfather   . Heart attack Paternal Grandfather   . Heart attack Brother     Social History   Socioeconomic History  . Marital status: Widowed    Spouse name: Not on file  . Number of children: Not on file  . Years of education: Not on file  . Highest education level: Not on file  Occupational History  . Not on file  Tobacco Use  . Smoking  status: Every Day    Packs/day: 1.00    Types: Cigarettes  . Smokeless tobacco: Never  . Tobacco comments:    Former smoker 09/30/21  Vaping Use  . Vaping Use: Never used  Substance and Sexual Activity  . Alcohol use: No  . Drug use: No  . Sexual activity: Not on file  Other Topics Concern  . Not on file  Social History Narrative  . Not on file   Social Determinants of Health   Financial Resource Strain: Not on file  Food Insecurity: Not on file  Transportation Needs: Not on file  Physical Activity: Not on file  Stress: Not on file  Social Connections: Not on file  Intimate Partner Violence: Not on file    Past Surgical History:  Procedure Laterality Date  . APPENDECTOMY    . BACK SURGERY    . CARDIOVERSION N/A 10/12/2021   Procedure: CARDIOVERSION;  Surgeon: Pixie Casino, MD;  Location: [facility] ENDOSCOPY;  Service: Cardiovascular;  Laterality: N/A;  . CHOLECYSTECTOMY    . CORONARY ANGIOPLASTY WITH STENT PLACEMENT  04/19/1999  . CORONARY ARTERY BYPASS GRAFT  04/19/2003      Current Outpatient Medications:  .  albuterol (PROVENTIL HFA;VENTOLIN HFA) 108 (90 BASE) MCG/ACT inhaler, Inhale 2 puffs into the lungs every 6 (six) hours as needed for  wheezing or shortness of breath., Disp: 1 Inhaler, Rfl: 2 .  apixaban (ELIQUIS) 5 MG TABS tablet, Take 1 tablet (5 mg total) by mouth 2 (two) times daily., Disp: 60 tablet, Rfl: 11 .  aspirin EC 81 MG tablet, Take 81 mg by mouth daily., Disp: , Rfl:  .  fexofenadine (ALLEGRA) 180 MG tablet, Take 180 mg by mouth daily., Disp: , Rfl:  .  fludrocortisone (FLORINEF) 0.1 MG tablet, Take 0.1 mg by mouth daily., Disp: , Rfl:  .  fluticasone (FLONASE) 50 MCG/ACT nasal spray, Place 2 sprays into both nostrils daily as needed for allergies., Disp: , Rfl:  .  gabapentin (NEURONTIN) 300 MG capsule, Take 600 mg by mouth at bedtime., Disp: , Rfl:  .  hydrocortisone (CORTEF) 10 MG tablet, Take 10 mg by mouth 2 (two) times daily., Disp: , Rfl:  .   metoprolol tartrate (LOPRESSOR) 25 MG tablet, Take 1 tablet (25 mg total) by mouth 2 (two) times daily., Disp: 180 tablet, Rfl: 3 .  naloxone (NARCAN) nasal spray 4 mg/0.1 mL, Place 1 spray into the nose once., Disp: , Rfl:  .  nitroGLYCERIN (NITROSTAT) 0.4 MG SL tablet, Place 1 tablet (0.4 mg total) under the tongue every 5 (five) minutes as needed for chest pain., Disp: 25 tablet, Rfl: 3 .  oxyCODONE-acetaminophen (PERCOCET) 10-325 MG per tablet, Take 1 tablet by mouth 4 (four) times daily., Disp: , Rfl:  .  pantoprazole (PROTONIX) 40 MG tablet, Take 40 mg by mouth daily., Disp: , Rfl:  .  potassium chloride SA (K-DUR,KLOR-CON) 20 MEQ tablet, Take 20 mEq by mouth daily., Disp: , Rfl: 11 .  torsemide (DEMADEX) 20 MG tablet, Take 40 mg by mouth 2 (two) times daily., Disp: , Rfl:     Physical Exam: There were no vitals taken for this visit.    Affect appropriate Healthy:  appears stated age HEENT: normal Neck supple with no adenopathy JVP normal no bruits no thyromegaly Lungs clear with no wheezing and good diaphragmatic motion Heart:  S1/S2 no murmur, no rub, gallop or click PMI normal post sternotomy  Abdomen: benighn, BS positve, no tenderness, no AAA no bruit.  No HSM or HJR post appendectomy and cholecystectomy  Distal pulses intact with no bruits post left radial harvest  Plus one bilateral edema Neuro non-focal Skin warm and dry No muscular weakness   Labs:   Lab Results  Component Value Date   WBC 6.5 09/30/2021   HGB 12.6 (L) 09/30/2021   HCT 38.3 (L) 09/30/2021   MCV 88.0 09/30/2021   PLT 186 09/30/2021   No results for input(s): "NA", "K", "CL", "CO2", "BUN", "CREATININE", "CALCIUM", "PROT", "BILITOT", "ALKPHOS", "ALT", "AST", "GLUCOSE" in the last 168 hours.  Invalid input(s): "LABALBU" Lab Results  Component Value Date   CKTOTAL 173 05/15/2007   TROPONINI <0.03 07/19/2014    Lab Results  Component Value Date   CHOL 182 03/22/2007   Lab Results   Component Value Date   HDL 33.7 (L) 03/22/2007   No results found for: "LDLCALC" Lab Results  Component Value Date   TRIG 293 [facility]) 03/22/2007   Lab Results  Component Value Date   CHOLHDL 5.4 CALC 03/22/2007   Lab Results  Component Value Date   LDLDIRECT 94.1 03/22/2007      Radiology: No results found.  EKG: Afib rate 154 poor R wave progression LAD    ASSESSMENT AND PLAN:  CAD/CABG:  distant with two arterial grafts Appears that he may have  had stents prior to CABG. No chest pain will need risk stratification after afib taken care of  HLD:  needs labs not on statin  Addisons:  f/u endocrine continue cortef and florinef Smoking:  counseled on cessation < 10 minutes lung cancer screeing CT continue proventil Edema:  dependant with varicosities and obesity see below AFib: on Eliquis CHADV VASC 3 post [facility] successful 10/12/21 Continue lopressor  ***  Signed: Charlton Haws 10/15/2021, 12:27 PM

## 2021-10-20 ENCOUNTER — Encounter: Payer: Self-pay | Admitting: Cardiovascular Disease

## 2021-10-20 ENCOUNTER — Ambulatory Visit (INDEPENDENT_AMBULATORY_CARE_PROVIDER_SITE_OTHER): Payer: Medicare HMO | Admitting: Cardiovascular Disease

## 2021-10-20 VITALS — BP 106/60 | HR 101 | Ht 74.0 in | Wt 201.8 lb

## 2021-10-20 DIAGNOSIS — Z951 Presence of aortocoronary bypass graft: Secondary | ICD-10-CM | POA: Diagnosis not present

## 2021-10-20 DIAGNOSIS — E785 Hyperlipidemia, unspecified: Secondary | ICD-10-CM

## 2021-10-20 DIAGNOSIS — I4819 Other persistent atrial fibrillation: Secondary | ICD-10-CM | POA: Diagnosis not present

## 2021-10-20 NOTE — Patient Instructions (Signed)
Medication Instructions:  Your physician recommends that you continue on your current medications as directed. Please refer to the Current Medication list given to you today.  *If you need a refill on your cardiac medications before your next appointment, please call your pharmacy*   Lab Work: NONE   If you have labs (blood work) drawn today and your tests are completely normal, you will receive your results only by: MyChart Message (if you have MyChart) OR A paper copy in the mail If you have any lab test that is abnormal or we need to change your treatment, we will call you to review the results.   Testing/Procedures: NONE    Follow-Up: At [facility], you and your health needs are our priority.  As part of our continuing mission to provide you with exceptional heart care, we have created designated Provider Care Teams.  These Care Teams include your primary Cardiologist (physician) and Advanced Practice Providers (APPs -  Physician Assistants and Nurse Practitioners) who all work together to provide you with the care you need, when you need it.  We recommend signing up for the patient portal called "MyChart".  Sign up information is provided on this After Visit Summary.  MyChart is used to connect with patients for Virtual Visits (Telemedicine).  Patients are able to view lab/test results, encounter notes, upcoming appointments, etc.  Non-urgent messages can be sent to your provider as well.   To learn more about what you can do with MyChart, go to [email or web address].    Your next appointment:   6 month(s)  The format for your next appointment:   In Person  Provider:   Peter Nishan, MD    Other Instructions Thank you for choosing [facility]!    Important Information About Sugar       

## 2021-11-05 ENCOUNTER — Ambulatory Visit: Payer: Medicare HMO | Admitting: Cardiovascular Disease

## 2021-11-19 ENCOUNTER — Encounter: Payer: Self-pay | Admitting: Internal Medicine

## 2021-11-19 ENCOUNTER — Ambulatory Visit: Payer: Medicare HMO | Admitting: Internal Medicine

## 2021-11-19 VITALS — BP 110/74 | HR 116 | Ht 74.0 in | Wt 204.8 lb

## 2021-11-19 DIAGNOSIS — I4819 Other persistent atrial fibrillation: Secondary | ICD-10-CM | POA: Diagnosis not present

## 2021-11-19 DIAGNOSIS — I2510 Atherosclerotic heart disease of native coronary artery without angina pectoris: Secondary | ICD-10-CM | POA: Diagnosis not present

## 2021-11-19 LAB — COMPREHENSIVE METABOLIC PANEL
ALT: 8 IU/L (ref 0–44)
AST: 11 IU/L (ref 0–40)
Albumin/Globulin Ratio: 1.8 (ref 1.2–2.2)
Albumin: 4.3 g/dL (ref 3.8–4.8)
Alkaline Phosphatase: 74 IU/L (ref 44–121)
BUN/Creatinine Ratio: 10 (ref 10–24)
BUN: 11 mg/dL (ref 8–27)
Bilirubin Total: 0.4 mg/dL (ref 0.0–1.2)
CO2: 24 mmol/L (ref 20–29)
Calcium: 9.5 mg/dL (ref 8.6–10.2)
Chloride: 99 mmol/L (ref 96–106)
Creatinine, Ser: 1.06 mg/dL (ref 0.76–1.27)
Globulin, Total: 2.4 g/dL (ref 1.5–4.5)
Glucose: 89 mg/dL (ref 70–99)
Potassium: 5 mmol/L (ref 3.5–5.2)
Sodium: 137 mmol/L (ref 134–144)
Total Protein: 6.7 g/dL (ref 6.0–8.5)
eGFR: 74 mL/min/{1.73_m2} (ref >59)

## 2021-11-19 LAB — T4, FREE: Free T4: 0.98 ng/dL (ref 0.82–1.77)

## 2021-11-19 LAB — TSH: TSH: 1.9 u[IU]/mL (ref 0.450–4.500)

## 2021-11-19 NOTE — Patient Instructions (Addendum)
Medication Instructions:  Your physician has recommended you make the following change in your medication:   Start: Tikosyn (Dofetilide) with the [facility].     Lab Work: None ordered.  If you have labs (blood work) drawn today and your tests are completely normal, you will receive your results only by: MyChart Message (if you have MyChart) OR A paper copy in the mail If you have any lab test that is abnormal or we need to change your treatment, we will call you to review the results.  Testing/Procedures: None ordered.  Follow-Up:  The [facility] will be contacting you regarding your upcoming Tikosyn admission to the hospital.      Provider:   Lewayne Bunting, MD{or one of the following Advanced Practice Providers on your designated Care Team:   Francis Dowse, [place] Casimiro Needle "Mardelle Matte" Tillery, PA-C   Dofetilide Capsules What is this medication? DOFETILIDE (doe FET il ide) treats a fast or irregular heartbeat (arrhythmia). It works by slowing down overactive electric signals in the heart, which stabilizes your heart rhythm. It belongs to a group of medications called antiarrhythmics. This medicine may be used for other purposes; ask your health care provider or pharmacist if you have questions. COMMON BRAND NAME(S): Tikosyn What should I tell my care team before I take this medication? They need to know if you have any of these conditions: Heart disease History of irregular heartbeat History of low levels of potassium or magnesium in the blood Kidney disease Liver disease An unusual or allergic reaction to dofetilide, other medications, foods, dyes, or preservatives Pregnant or trying to get pregnant Breast-feeding How should I use this medication? Take this medication by mouth with a glass of water. Follow the directions on the prescription label. Do not take with grapefruit juice. You can take it with or without food. If it upsets your stomach, take it with food. Take your  medication at regular intervals. Do not take it more often than directed. Do not stop taking except on your care team's advice. A special MedGuide will be given to you by the pharmacist with each prescription and refill. Be sure to read this information carefully each time. Talk to your care team about the use of this medication in children. Special care may be needed. Overdosage: If you think you have taken too much of this medicine contact a poison control center or emergency room at once. NOTE: This medicine is only for you. Do not share this medicine with others. What if I miss a dose? If you miss a dose, skip it. Take your next dose at the normal time. Do not take extra or 2 doses at the same time to make up for the missed dose. What may interact with this medication? Do not take this medication with any of the following: Cimetidine Cisapride Dolutegravir Dronedarone Erdafitinib Hydrochlorothiazide Ketoconazole Megestrol Pimozide Prochlorperazine Thioridazine Trimethoprim Verapamil This medication may also interact with the following: Amiloride Cannabinoids Certain antibiotics like erythromycin or clarithromycin Certain antiviral medications for HIV or hepatitis Certain medications for depression, anxiety, or psychotic disorders Digoxin Diltiazem Grapefruit juice Metformin Nefazodone Other medications that prolong the QT interval (an abnormal heart rhythm) Quinine Triamterene Zafirlukast Ziprasidone This list may not describe all possible interactions. Give your health care provider a list of all the medicines, herbs, non-prescription drugs, or dietary supplements you use. Also tell them if you smoke, drink alcohol, or use illegal drugs. Some items may interact with your medicine. What should I watch for while using  this medication? Your condition will be monitored carefully while you are receiving this medication. What side effects may I notice from receiving this  medication? Side effects that you should report to your care team as soon as possible: Allergic reactions--skin rash, itching, hives, swelling of the face, lips, tongue, or throat Chest pain Heart rhythm changes--fast or irregular heartbeat, dizziness, feeling faint or lightheaded, chest pain, trouble breathing Side effects that usually do not require medical attention (report to your care team if they continue or are bothersome): Dizziness Headache Nausea Stomach pain Trouble sleeping This list may not describe all possible side effects. Call your doctor for medical advice about side effects. You may report side effects to FDA at 1-800-FDA-1088. Where should I keep my medication? Keep out of the reach of children. Store at room temperature between 15 and 30 degrees C (59 and 86 degrees F). Throw away any unused medication after the expiration date. NOTE: This sheet is a summary. It may not cover all possible information. If you have questions about this medicine, talk to your doctor, pharmacist, or health care provider.  2023 Elsevier/Gold Standard (2021-03-05 00:00:00)

## 2021-11-19 NOTE — Progress Notes (Signed)
HPI Mr. Victor Nelson is referred by Dr. Eden Emms for evaluation of persistent atrial fib. He has a h/o adrenal insufficiency and an ICM, s/p MI, s/p CABG. The patient had DCCV and had ERAF. He notes that he did feel better in NSR but felt worse after a few days. He does not have palpitations. His rate was controlled when he saw Dr. Eden Emms. He has not had syncope. A review of his ECG's in NSR shows a corrected QT of 440. In atrial fib the QT is longer.  No Known Allergies   Current Outpatient Medications  Medication Sig Dispense Refill   albuterol (PROVENTIL HFA;VENTOLIN HFA) 108 (90 BASE) MCG/ACT inhaler Inhale 2 puffs into the lungs every 6 (six) hours as needed for wheezing or shortness of breath. 1 Inhaler 2   apixaban (ELIQUIS) 5 MG TABS tablet Take 1 tablet (5 mg total) by mouth 2 (two) times daily. 60 tablet 11   aspirin EC 81 MG tablet Take 81 mg by mouth daily.     fexofenadine (ALLEGRA) 180 MG tablet Take 180 mg by mouth daily.     fludrocortisone (FLORINEF) 0.1 MG tablet Take 0.1 mg by mouth daily.     fluticasone (FLONASE) 50 MCG/ACT nasal spray Place 2 sprays into both nostrils daily as needed for allergies.     gabapentin (NEURONTIN) 300 MG capsule Take 600 mg by mouth at bedtime.     hydrocortisone (CORTEF) 10 MG tablet Take 10 mg by mouth 2 (two) times daily.     metoprolol tartrate (LOPRESSOR) 25 MG tablet Take 1 tablet (25 mg total) by mouth 2 (two) times daily. 180 tablet 3   naloxone (NARCAN) nasal spray 4 mg/0.1 mL Place 1 spray into the nose once.     nitroGLYCERIN (NITROSTAT) 0.4 MG SL tablet Place 1 tablet (0.4 mg total) under the tongue every 5 (five) minutes as needed for chest pain. 25 tablet 3   oxyCODONE-acetaminophen (PERCOCET) 10-325 MG per tablet Take 1 tablet by mouth 4 (four) times daily.     pantoprazole (PROTONIX) 40 MG tablet Take 40 mg by mouth daily.     potassium chloride SA (K-DUR,KLOR-CON) 20 MEQ tablet Take 20 mEq by mouth daily.  11   torsemide  (DEMADEX) 20 MG tablet Take 40 mg by mouth 2 (two) times daily.     No current facility-administered medications for this visit.     Past Medical History:  Diagnosis Date   Addison's disease (HCC)    Chronic back pain    Coronary artery disease    Dyspnea    GERD (gastroesophageal reflux disease)    Hypercholesterolemia    Hypertension    Myocardial infarct (HCC) 2001    ROS:   All systems reviewed and negative except as noted in the HPI.   Past Surgical History:  Procedure Laterality Date   APPENDECTOMY     BACK SURGERY     CARDIOVERSION N/A 10/12/2021   Procedure: CARDIOVERSION;  Surgeon: Chrystie Nose, MD;  Location: [facility] ENDOSCOPY;  Service: Cardiovascular;  Laterality: N/A;   CHOLECYSTECTOMY     CORONARY ANGIOPLASTY WITH STENT PLACEMENT  04/19/1999   CORONARY ARTERY BYPASS GRAFT  04/19/2003     Family History  Problem Relation Age of Onset   Liver disease Mother    Stroke Father    Addison's disease Brother    Diabetes Maternal Grandmother    Heart attack Maternal Grandfather    Heart attack Paternal Grandfather    Heart  attack Brother      Social History   Socioeconomic History   Marital status: Widowed    Spouse name: Not on file   Number of children: Not on file   Years of education: Not on file   Highest education level: Not on file  Occupational History   Not on file  Tobacco Use   Smoking status: Every Day    Packs/day: 1.00    Types: Cigarettes   Smokeless tobacco: Never   Tobacco comments:    Former smoker 09/30/21  Vaping Use   Vaping Use: Never used  Substance and Sexual Activity   Alcohol use: No   Drug use: No   Sexual activity: Not on file  Other Topics Concern   Not on file  Social History Narrative   Not on file   Social Determinants of Health   Financial Resource Strain: Not on file  Food Insecurity: Not on file  Transportation Needs: Not on file  Physical Activity: Not on file  Stress: Not on file  Social  Connections: Not on file  Intimate Partner Violence: Not on file     BP 110/74   Pulse (!) 116   Ht 6\' 2"  (1.88 m)   Wt 204 lb 12.8 oz (92.9 kg)   SpO2 99%   BMI 26.29 kg/m   Physical Exam:  Well appearing NAD HEENT: Unremarkable Neck:  No JVD, no thyromegally Lymphatics:  No adenopathy Back:  No CVA tenderness Lungs:  Clear HEART:  Regular rate rhythm, no murmurs, no rubs, no clicks Abd:  soft, positive bowel sounds, no organomegally, no rebound, no guarding Ext:  2 plus pulses, no edema, no cyanosis, no clubbing Skin:  No rashes no nodules Neuro:  CN II through XII intact, motor grossly intact  EKG - Atrial fib with a RVR  Assess/Plan:  Persistent atrial fib - His QT is borderline but I think it would be reasonable to try dofetilide, realizing he may not be able to take the needed dose to go back to NSR. AV node ablation and PPM would be another option if dofetilide not working. He is a poor candidate for PVI.  CAD - he is s/p CABG. No change in meds. HTN - his bp is well controlled. No change in meds.  Elzena Muston,MD

## 2021-11-21 ENCOUNTER — Telehealth: Payer: Self-pay | Admitting: Pharmacist

## 2021-11-21 NOTE — Telephone Encounter (Signed)
Medication list reviewed in anticipation of upcoming Tikosyn initiation. Patient is not taking any contraindicated medications. He is on one QTc prolonging medication, albuterol. This should be monitored. No change in therapy needed.   Patient is anticoagulated on Eliquis on the appropriate dose. Please ensure that patient has not missed any anticoagulation doses in the 3 weeks prior to Tikosyn initiation.   Patient will need to be counseled to avoid use of Benadryl while on Tikosyn and in the 2-3 days prior to Tikosyn initiation.  Please ensure K if at least 4 and Mag is at least 2 especially given patient is on torsemide.

## 2021-11-22 NOTE — Addendum Note (Signed)
Addended by: Mariam Dollar on: 11/22/2021 10:04 AM   Modules accepted: Orders

## 2021-11-25 ENCOUNTER — Encounter: Payer: Self-pay | Admitting: "Endocrinology

## 2021-11-25 ENCOUNTER — Ambulatory Visit (INDEPENDENT_AMBULATORY_CARE_PROVIDER_SITE_OTHER): Payer: Medicare HMO | Admitting: "Endocrinology

## 2021-11-25 VITALS — BP 112/56 | HR 72 | Ht 74.0 in | Wt 197.6 lb

## 2021-11-25 DIAGNOSIS — E2740 Unspecified adrenocortical insufficiency: Secondary | ICD-10-CM | POA: Diagnosis not present

## 2021-11-25 DIAGNOSIS — R42 Dizziness and giddiness: Secondary | ICD-10-CM | POA: Diagnosis not present

## 2021-11-25 DIAGNOSIS — F17200 Nicotine dependence, unspecified, uncomplicated: Secondary | ICD-10-CM

## 2021-11-25 MED ORDER — FLUDROCORTISONE ACETATE 0.1 MG PO TABS: 0.2000 mg | ORAL_TABLET | Freq: Every day | ORAL | 1 refills | Status: AC

## 2021-11-25 NOTE — Progress Notes (Signed)
11/25/2021, 5:50 PM  Endocrinology follow-up note   Subjective:    Patient ID: Victor Nelson, male    DOB: 06-20-1945, PCP Victor Sacramento, NP   Past Medical History:  Diagnosis Date   Addison's disease [facility])    Chronic back pain    Coronary artery disease    Dyspnea    GERD (gastroesophageal reflux disease)    Hypercholesterolemia    Hypertension    Myocardial infarct [facility]) 2001   Past Surgical History:  Procedure Laterality Date   APPENDECTOMY     BACK SURGERY     CARDIOVERSION N/A 10/12/2021   Procedure: CARDIOVERSION;  Surgeon: Victor Nose, MD;  Location: [facility] ENDOSCOPY;  Service: Cardiovascular;  Laterality: N/A;   CHOLECYSTECTOMY     CORONARY ANGIOPLASTY WITH STENT PLACEMENT  04/19/1999   CORONARY ARTERY BYPASS GRAFT  04/19/2003   Social History   Socioeconomic History   Marital status: Widowed    Spouse name: Not on file   Number of children: Not on file   Years of education: Not on file   Highest education level: Not on file  Occupational History   Not on file  Tobacco Use   Smoking status: Every Day    Packs/day: 1.00    Types: Cigarettes   Smokeless tobacco: Never   Tobacco comments:    Former smoker 09/30/21  Vaping Use   Vaping Use: Never used  Substance and Sexual Activity   Alcohol use: No   Drug use: No   Sexual activity: Not on file  Other Topics Concern   Not on file  Social History Narrative   Not on file   Social Determinants of Health   Financial Resource Strain: Not on file  Food Insecurity: Not on file  Transportation Needs: Not on file  Physical Activity: Not on file  Stress: Not on file  Social Connections: Not on file   Family History  Problem Relation Age of Onset   Liver disease Mother    Stroke Father    Addison's disease Brother    Diabetes Maternal Grandmother    Heart attack Maternal Grandfather    Heart attack Paternal Grandfather    Heart attack Brother     Outpatient Encounter Medications as of 11/25/2021  Medication Sig   albuterol (PROVENTIL HFA;VENTOLIN HFA) 108 (90 BASE) MCG/ACT inhaler Inhale 2 puffs into the lungs every 6 (six) hours as needed for wheezing or shortness of breath.   apixaban (ELIQUIS) 5 MG TABS tablet Take 1 tablet (5 mg total) by mouth 2 (two) times daily.   aspirin EC 81 MG tablet Take 81 mg by mouth daily.   fexofenadine (ALLEGRA) 180 MG tablet Take 180 mg by mouth daily.   fludrocortisone (FLORINEF) 0.1 MG tablet Take 2 tablets (0.2 mg total) by mouth daily.   fluticasone (FLONASE) 50 MCG/ACT nasal spray Place 2 sprays into both nostrils daily as needed for allergies.   gabapentin (NEURONTIN) 300 MG capsule Take 600 mg by mouth at bedtime.   hydrocortisone (CORTEF) 10 MG tablet Take 10 mg by mouth 2 (two) times daily.   metoprolol tartrate (LOPRESSOR) 25 MG tablet Take 1 tablet (25 mg total) by mouth 2 (two) times daily.   naloxone (  NARCAN) nasal spray 4 mg/0.1 mL Place 1 spray into the Nelson once.   nitroGLYCERIN (NITROSTAT) 0.4 MG SL tablet Place 1 tablet (0.4 mg total) under the tongue every 5 (five) minutes as needed for chest pain.   oxyCODONE-acetaminophen (PERCOCET) 10-325 MG per tablet Take 1 tablet by mouth 4 (four) times daily.   pantoprazole (PROTONIX) 40 MG tablet Take 40 mg by mouth daily.   potassium chloride SA (K-DUR,KLOR-CON) 20 MEQ tablet Take 20 mEq by mouth daily.   torsemide (DEMADEX) 20 MG tablet Take 40 mg by mouth 2 (two) times daily.   [DISCONTINUED] fludrocortisone (FLORINEF) 0.1 MG tablet Take 0.1 mg by mouth daily.   No facility-administered encounter medications on file as of 11/25/2021.   ALLERGIES: No Known Allergies  VACCINATION STATUS: Immunization History  Administered Date(s) Administered   Influenza Split 04/16/2007   Pneumococcal Polysaccharide-23 04/16/2007   Tdap 03/09/2013    HPI Victor Nelson is 76 y.o. male who presents today with a medical history as above. he is  being seen in follow-up after he was seen in consultation for adrenal insufficiency requested by Victor Sacramento, NP.   He reports that he was diagnosed with adrenal insufficiency at age of 73.  He has been on hydrocortisone and fludrocortisone for several decades following that.   He denies any history of adrenal injury or surgery.  He reports similar history in his brother saying that his brother was born without adrenal gland. He has multiple medical problems including  coronary artery disease which required angioplasty, CHF, hyperlipidemia, chronic smoking, chronic pain syndrome on Percocet. He reports ongoing lower extremity edema , fluctuating body weight.  He remains on hydrocortisone 10 mg p.o. twice daily, fludrocortisone 0.1 mg p.o. daily.  More recently, he complains of dizziness/lightheadedness.  He has cardiac dysrhythmias for which cardioversion was attempted. He denies nausea, vomiting, abdominal pain.  He has COPD on bronchodilators.  He remains a smoker.  Review of Systems  Constitutional: + Fluctuating moderate, + fatigue , no subjective hyperthermia, no subjective hypothermia Eyes: no blurry vision, no xerophthalmia ENT: no sore throat, no nodules palpated in throat, no dysphagia/odynophagia, no hoarseness Cardiovascular: no Chest Pain, no Shortness of Breath, no palpitations, no leg swelling Respiratory: no cough, no shortness of breath Gastrointestinal: no Nausea/Vomiting/Diarhhea Musculoskeletal: no muscle/joint aches Skin: no rashes Neurological: no tremors, no numbness, no tingling, no dizziness Psychiatric: no depression, no anxiety  Objective:       11/25/2021    3:18 PM 11/19/2021   12:15 PM 10/20/2021    1:25 PM  Vitals with BMI  Height 6\' 2"  6\' 2"  6\' 2"   Weight 197 lbs 10 oz 204 lbs 13 oz 201 lbs 13 oz  BMI 25.36 26.28 25.9  Systolic 112 110  Diastolic 56 74 60  Pulse 72 116 101    BP (!) 112/56   Pulse 72   Ht 6\' 2"  (1.88 m)   Wt 197 lb 9.6 oz (89.6  kg)   BMI 25.37 kg/m   Wt Readings from Last 3 Encounters:  11/25/21 197 lb 9.6 oz (89.6 kg)  11/19/21 204 lb 12.8 oz (92.9 kg)  10/20/21 201 lb 12.8 oz (91.5 kg)    Physical Exam  Constitutional:  Body mass index is 25.37 kg/m.,  not in acute distress, normal state of mind Eyes: PERRLA, EOMI, no exophthalmos ENT: moist mucous membranes, no gross thyromegaly, no gross cervical lymphadenopathy, + edentulous .   CMP ( most recent) CMP  Component Value Date/Time   NA 137 11/18/2021 1339   K 5.0 11/18/2021 1339   CL 99 11/18/2021 1339   CO2 24 11/18/2021 1339   GLUCOSE 89 11/18/2021 1339   GLUCOSE 102 (H) 09/30/2021 1423   BUN 11 11/18/2021 1339   CREATININE 1.06 11/18/2021 1339   CALCIUM 9.5 11/18/2021 1339   PROT 6.7 11/18/2021 1339   ALBUMIN 4.3 11/18/2021 1339   AST 11 11/18/2021 1339   ALT 8 11/18/2021 1339   ALKPHOS 74 11/18/2021 1339   BILITOT 0.4 11/18/2021 1339   GFRNONAA 55 (L) 09/30/2021 1423   GFRAA 83 (L) 07/20/2014 0656     Lipid Panel ( most recent) Lipid Panel     Component Value Date/Time   CHOL 182 03/22/2007 1154   TRIG 293 (HH) 03/22/2007 1154   HDL 33.7 (L) 03/22/2007 1154   CHOLHDL 5.4 CALC 03/22/2007 1154   VLDL 59 (H) 03/22/2007 1154   LDLDIRECT 94.1 03/22/2007 1154      Lab Results  Component Value Date   TSH 1.900 11/18/2021   TSH 2.41 03/22/2007   FREET4 0.98 11/18/2021   FREET4 0.5 (L) 03/22/2007     Assessment & Plan:   1. Adrenal insufficiency (HCC)   he has longstanding adrenal insufficiency.  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  In any case, he will continue to need steroid replacement therapy. I advised him to continue hydrocortisone 10 mg p.o. twice daily, advised him to increase fludrocortisone to 0.2 mg p.o. daily at breakfast.    He is allowed to double his steroids for 3-5 days during stress,  illness, surgical procedures.  I discussed and gave him paperwork  for him to get medical alert.  Regarding his recent unexplained dizziness/lightheadedness, considering his high risk for cardiovascular disease, he will need bilateral carotid Doppler to assess his carotid circulation.  This will be ordered for him to get anytime in the next several days. His previsit labs show euthyroid state.  His point-of-care A1c was 5.5%, indicating absence of prediabetes/diabetes. He is not a good candidate for testosterone replacement.   He is advised on smoking cessation . The patient was counseled on the dangers of tobacco use, and was advised to quit.  Reviewed strategies to maximize success, including removing cigarettes and smoking materials from environment.   - he is advised to maintain close follow up with Victor Sacramento, NP for primary care needs.  I spent 31 minutes in the care of the patient today including review of labs from Thyroid Function, CMP, and other relevant labs ; imaging/biopsy records (current and previous including abstractions from other facilities); face-to-face time discussing  his lab results and symptoms, medications doses, his options of short and long term treatment based on the latest standards of care / guidelines;   and documenting the encounter.  Victor Nelson  participated in the discussions, expressed understanding, and voiced agreement with the above plans.  All questions were answered to his satisfaction. he is encouraged to contact clinic should he have any questions or concerns prior to his return visit.   Follow up plan: Return in about 6 months (around 05/28/2022), or Carotid Dopller anytime, for F/U with Pre-visit Labs.   Marquis Lunch, MD [facility] [address] [place], [place] [address] Phone: [phone]  Fax: [phone]     11/25/2021, 5:50 PM  This  note was partially dictated with voice  recognition software. Similar sounding words can be transcribed inadequately or may not  be corrected upon review.

## 2021-11-26 ENCOUNTER — Encounter (HOSPITAL_COMMUNITY): Payer: Self-pay

## 2021-11-29 ENCOUNTER — Inpatient Hospital Stay (HOSPITAL_COMMUNITY)
Admission: AD | Admit: 2021-11-29 | Discharge: 2021-12-01 | DRG: 309 | Disposition: A | Discharge status: Home / Self Care | Payer: Medicare HMO | Source: Ambulatory Visit | Attending: Cardiology | Admitting: Cardiology

## 2021-11-29 ENCOUNTER — Encounter (HOSPITAL_COMMUNITY): Payer: Self-pay | Admitting: Physician Assistant

## 2021-11-29 ENCOUNTER — Ambulatory Visit (HOSPITAL_COMMUNITY)
Admission: RE | Admit: 2021-11-29 | Discharge: 2021-11-29 | Disposition: A | Discharge status: Home / Self Care | Payer: Medicare HMO | Source: Ambulatory Visit | Attending: Physician Assistant | Admitting: Physician Assistant

## 2021-11-29 VITALS — BP 112/62 | HR 97 | Ht 74.0 in | Wt 197.4 lb

## 2021-11-29 DIAGNOSIS — Z951 Presence of aortocoronary bypass graft: Secondary | ICD-10-CM

## 2021-11-29 DIAGNOSIS — I2510 Atherosclerotic heart disease of native coronary artery without angina pectoris: Secondary | ICD-10-CM | POA: Diagnosis present

## 2021-11-29 DIAGNOSIS — I4819 Other persistent atrial fibrillation: Secondary | ICD-10-CM

## 2021-11-29 DIAGNOSIS — I252 Old myocardial infarction: Secondary | ICD-10-CM

## 2021-11-29 DIAGNOSIS — I10 Essential (primary) hypertension: Secondary | ICD-10-CM | POA: Diagnosis present

## 2021-11-29 DIAGNOSIS — E271 Primary adrenocortical insufficiency: Secondary | ICD-10-CM | POA: Diagnosis present

## 2021-11-29 DIAGNOSIS — D6869 Other thrombophilia: Secondary | ICD-10-CM | POA: Diagnosis present

## 2021-11-29 DIAGNOSIS — Z79899 Other long term (current) drug therapy: Secondary | ICD-10-CM

## 2021-11-29 DIAGNOSIS — F17210 Nicotine dependence, cigarettes, uncomplicated: Secondary | ICD-10-CM | POA: Diagnosis present

## 2021-11-29 DIAGNOSIS — E7800 Pure hypercholesterolemia, unspecified: Secondary | ICD-10-CM | POA: Diagnosis present

## 2021-11-29 DIAGNOSIS — J449 Chronic obstructive pulmonary disease, unspecified: Secondary | ICD-10-CM | POA: Diagnosis present

## 2021-11-29 DIAGNOSIS — Z955 Presence of coronary angioplasty implant and graft: Secondary | ICD-10-CM | POA: Diagnosis not present

## 2021-11-29 DIAGNOSIS — Z7901 Long term (current) use of anticoagulants: Secondary | ICD-10-CM

## 2021-11-29 DIAGNOSIS — K219 Gastro-esophageal reflux disease without esophagitis: Secondary | ICD-10-CM | POA: Diagnosis present

## 2021-11-29 DIAGNOSIS — Z8249 Family history of ischemic heart disease and other diseases of the circulatory system: Secondary | ICD-10-CM

## 2021-11-29 DIAGNOSIS — M549 Dorsalgia, unspecified: Secondary | ICD-10-CM | POA: Diagnosis present

## 2021-11-29 DIAGNOSIS — G8929 Other chronic pain: Secondary | ICD-10-CM | POA: Diagnosis present

## 2021-11-29 DIAGNOSIS — Z7982 Long term (current) use of aspirin: Secondary | ICD-10-CM | POA: Diagnosis not present

## 2021-11-29 LAB — BASIC METABOLIC PANEL
Anion gap: 9 (ref 5–15)
Anion gap: 5 (ref 5–15)
BUN: 15 mg/dL (ref 8–23)
BUN: 13 mg/dL (ref 8–23)
CO2: 23 mmol/L (ref 22–32)
CO2: 31 mmol/L (ref 22–32)
Calcium: 8.9 mg/dL (ref 8.9–10.3)
Calcium: 9 mg/dL (ref 8.9–10.3)
Chloride: 105 mmol/L (ref 98–111)
Chloride: 103 mmol/L (ref 98–111)
Creatinine, Ser: 1.28 mg/dL — ABNORMAL HIGH (ref 0.61–1.24)
Creatinine, Ser: 1.26 mg/dL — ABNORMAL HIGH (ref 0.61–1.24)
GFR, Estimated: 59 mL/min — ABNORMAL LOW (ref >60)
GFR, Estimated: >60 mL/min (ref >60)
Glucose, Bld: 79 mg/dL (ref 70–99)
Glucose, Bld: 97 mg/dL (ref 70–99)
Potassium: 3.5 mmol/L (ref 3.5–5.1)
Potassium: 4.4 mmol/L (ref 3.5–5.1)
Sodium: 137 mmol/L (ref 135–145)
Sodium: 139 mmol/L (ref 135–145)

## 2021-11-29 LAB — MAGNESIUM: Magnesium: 2.2 mg/dL (ref 1.7–2.4)

## 2021-11-29 MED ORDER — OXYCODONE-ACETAMINOPHEN 5-325 MG PO TABS
1.0000 | ORAL_TABLET | Freq: Four times a day (QID) | ORAL | Status: DC
  Administered 2021-11-29 – 2021-11-30 (×3): 1 via ORAL
  Filled 2021-11-29 (×3): qty 1

## 2021-11-29 MED ORDER — GABAPENTIN 300 MG PO CAPS
600.0000 mg | ORAL_CAPSULE | Freq: Every day | ORAL | Status: DC
  Administered 2021-11-29 – 2021-11-30 (×2): 600 mg via ORAL
  Filled 2021-11-29 (×2): qty 2

## 2021-11-29 MED ORDER — HYDROCORTISONE 10 MG PO TABS
10.0000 mg | ORAL_TABLET | Freq: Two times a day (BID) | ORAL | Status: DC
Start: 2021-11-29 — End: 2021-12-01
  Administered 2021-11-29 – 2021-12-01 (×4): 10 mg via ORAL
  Filled 2021-11-29 (×4): qty 1

## 2021-11-29 MED ORDER — FLUTICASONE PROPIONATE 50 MCG/ACT NA SUSP
2.0000 | Freq: Every day | NASAL | Status: DC | PRN
Start: 2021-11-29 — End: 2021-12-01
  Filled 2021-11-29: qty 16

## 2021-11-29 MED ORDER — APIXABAN 5 MG PO TABS
5.0000 mg | ORAL_TABLET | Freq: Two times a day (BID) | ORAL | Status: DC
  Administered 2021-11-29 – 2021-12-01 (×4): 5 mg via ORAL
  Filled 2021-11-29 (×4): qty 1

## 2021-11-29 MED ORDER — POTASSIUM CHLORIDE CRYS ER 20 MEQ PO TBCR
20.0000 meq | EXTENDED_RELEASE_TABLET | Freq: Every day | ORAL | Status: DC
  Administered 2021-11-30 – 2021-12-01 (×2): 20 meq via ORAL
  Filled 2021-11-29 (×2): qty 1

## 2021-11-29 MED ORDER — LORATADINE 10 MG PO TABS
10.0000 mg | ORAL_TABLET | Freq: Every day | ORAL | Status: DC
  Administered 2021-11-30 – 2021-12-01 (×2): 10 mg via ORAL
  Filled 2021-11-29 (×2): qty 1

## 2021-11-29 MED ORDER — SODIUM CHLORIDE 0.9% FLUSH
3.0000 mL | Freq: Two times a day (BID) | INTRAVENOUS | Status: DC
Start: 2021-11-29 — End: 2021-12-01
  Administered 2021-11-29 – 2021-12-01 (×4): 3 mL via INTRAVENOUS

## 2021-11-29 MED ORDER — METOPROLOL TARTRATE 25 MG PO TABS
25.0000 mg | ORAL_TABLET | Freq: Two times a day (BID) | ORAL | Status: DC
  Administered 2021-11-29 – 2021-11-30 (×2): 25 mg via ORAL
  Filled 2021-11-29 (×3): qty 1

## 2021-11-29 MED ORDER — OXYCODONE HCL 5 MG PO TABS
5.0000 mg | ORAL_TABLET | Freq: Four times a day (QID) | ORAL | Status: DC
  Administered 2021-11-29 – 2021-11-30 (×3): 5 mg via ORAL
  Filled 2021-11-29 (×3): qty 1

## 2021-11-29 MED ORDER — ALBUTEROL SULFATE (2.5 MG/3ML) 0.083% IN NEBU: 2.5000 mg | INHALATION_SOLUTION | Freq: Four times a day (QID) | RESPIRATORY_TRACT | Status: DC | PRN

## 2021-11-29 MED ORDER — SODIUM CHLORIDE 0.9% FLUSH: 3.0000 mL | INTRAVENOUS | Status: DC | PRN

## 2021-11-29 MED ORDER — DOFETILIDE 500 MCG PO CAPS
500.0000 ug | ORAL_CAPSULE | Freq: Two times a day (BID) | ORAL | Status: DC
  Administered 2021-11-29: 500 ug via ORAL
  Filled 2021-11-29: qty 1

## 2021-11-29 MED ORDER — ORAL CARE MOUTH RINSE: 15.0000 mL | OROMUCOSAL | Status: DC | PRN

## 2021-11-29 MED ORDER — FLUDROCORTISONE ACETATE 0.1 MG PO TABS
0.2000 mg | ORAL_TABLET | Freq: Every day | ORAL | Status: DC
  Administered 2021-11-30 – 2021-12-01 (×2): 0.2 mg via ORAL
  Filled 2021-11-29 (×2): qty 2

## 2021-11-29 MED ORDER — ASPIRIN 81 MG PO TBEC
81.0000 mg | DELAYED_RELEASE_TABLET | Freq: Every day | ORAL | Status: DC
  Administered 2021-11-30 – 2021-12-01 (×2): 81 mg via ORAL
  Filled 2021-11-29 (×2): qty 1

## 2021-11-29 MED ORDER — PANTOPRAZOLE SODIUM 40 MG PO TBEC
40.0000 mg | DELAYED_RELEASE_TABLET | Freq: Every day | ORAL | Status: DC
  Administered 2021-11-30 – 2021-12-01 (×2): 40 mg via ORAL
  Filled 2021-11-29 (×2): qty 1

## 2021-11-29 MED ORDER — NALOXONE HCL 4 MG/0.1ML NA LIQD
1.0000 | Freq: Once | NASAL | Status: DC
Start: 2021-11-29 — End: 2021-11-29
  Filled 2021-11-29: qty 8

## 2021-11-29 MED ORDER — POTASSIUM CHLORIDE CRYS ER 20 MEQ PO TBCR
60.0000 meq | EXTENDED_RELEASE_TABLET | ORAL | Status: AC
  Administered 2021-11-29: 60 meq via ORAL
  Filled 2021-11-29: qty 3

## 2021-11-29 MED ORDER — TORSEMIDE 20 MG PO TABS
40.0000 mg | ORAL_TABLET | Freq: Two times a day (BID) | ORAL | Status: DC
  Administered 2021-11-29 – 2021-12-01 (×4): 40 mg via ORAL
  Filled 2021-11-29 (×4): qty 2

## 2021-11-29 MED ORDER — OXYCODONE-ACETAMINOPHEN 10-325 MG PO TABS: 1.0000 | ORAL_TABLET | Freq: Four times a day (QID) | ORAL | Status: DC

## 2021-11-29 MED ORDER — SODIUM CHLORIDE 0.9 % IV SOLN
250.0000 mL | INTRAVENOUS | Status: DC | PRN
Start: 2021-11-29 — End: 2021-12-01

## 2021-11-29 MED ORDER — NITROGLYCERIN 0.4 MG SL SUBL: 0.4000 mg | SUBLINGUAL_TABLET | SUBLINGUAL | Status: DC | PRN

## 2021-11-29 MED ORDER — ALBUTEROL SULFATE HFA 108 (90 BASE) MCG/ACT IN AERS: 2.0000 | INHALATION_SPRAY | Freq: Four times a day (QID) | RESPIRATORY_TRACT | Status: DC | PRN

## 2021-11-29 NOTE — Progress Notes (Signed)
Primary Care Physician: [name], NP Primary Cardiologist: Dr Eden Emms Primary Electrophysiologist: Dr Ladona Ridgel Referring Physician: Dr Iven Finn Victor Nelson is a 76 y.o. male with a history of CAD s/p CABG, Addison's disease COPD, tobacco abuse, HTN, HLD, chronic back pain, atrial fibrillation who presents for follow up in the [facility].  The patient was initially diagnosed with atrial fibrillation 09/01/21. He was sent to Dr Eden Emms after patient was found to have an elevated heart rate at a different provider office. He did not have any awareness of his afib at that time. ECG showed arib with HR 154 bpm. He was started on metoprolol for rate control and Eliquis for a CHADS2VASC score of 3. He underwent DCCV on 10/12/21 but had early return of afib. He was seen by Dr Ladona Ridgel 11/19/21 who recommended dofetilide.  On follow up today, patient presents for dofetilide loading. He denies any missed doses of anticoagulation in the past three weeks, has not taken benadryl recently. He does have intermittent dizziness when in afib.   Today, he denies symptoms of palpitations, chest pain, shortness of breath, orthopnea, PND, lower extremity edema, presyncope, syncope, snoring, daytime somnolence, bleeding, or neurologic sequela. The patient is tolerating medications without difficulties and is otherwise without complaint today.    Atrial Fibrillation Risk Factors:  he does not have symptoms or diagnosis of sleep apnea. he does not have a history of rheumatic fever.   he has a BMI of Body mass index is 25.34 kg/m.Marland Kitchen Filed Weights   11/29/21 1142  Weight: 89.5 kg    Family History  Problem Relation Age of Onset   Liver disease Mother    Stroke Father    Addison's disease Brother    Diabetes Maternal Grandmother    Heart attack Maternal Grandfather    Heart attack Paternal Grandfather    Heart attack Brother      Atrial Fibrillation Management  history:  Previous antiarrhythmic drugs: none Previous cardioversions: 10/12/21 Previous ablations: none CHADS2VASC score: 3 Anticoagulation history: Eliquis   Past Medical History:  Diagnosis Date   Addison's disease (HCC)    Chronic back pain    Coronary artery disease    Dyspnea    GERD (gastroesophageal reflux disease)    Hypercholesterolemia    Hypertension    Myocardial infarct (HCC) 2001   Past Surgical History:  Procedure Laterality Date   APPENDECTOMY     BACK SURGERY     CARDIOVERSION N/A 10/12/2021   Procedure: CARDIOVERSION;  Surgeon: Chrystie Nose, MD;  Location: [facility] ENDOSCOPY;  Service: Cardiovascular;  Laterality: N/A;   CHOLECYSTECTOMY     CORONARY ANGIOPLASTY WITH STENT PLACEMENT  04/19/1999   CORONARY ARTERY BYPASS GRAFT  04/19/2003    Current Outpatient Medications  Medication Sig Dispense Refill   albuterol (PROVENTIL HFA;VENTOLIN HFA) 108 (90 BASE) MCG/ACT inhaler Inhale 2 puffs into the lungs every 6 (six) hours as needed for wheezing or shortness of breath. 1 Inhaler 2   apixaban (ELIQUIS) 5 MG TABS tablet Take 1 tablet (5 mg total) by mouth 2 (two) times daily. 60 tablet 11   aspirin EC 81 MG tablet Take 81 mg by mouth daily.     fexofenadine (ALLEGRA) 180 MG tablet Take 180 mg by mouth daily.     fludrocortisone (FLORINEF) 0.1 MG tablet Take 2 tablets (0.2 mg total) by mouth daily. 180 tablet 1   fluticasone (FLONASE) 50 MCG/ACT nasal spray Place 2 sprays into both nostrils  daily as needed for allergies.     gabapentin (NEURONTIN) 300 MG capsule Take 600 mg by mouth at bedtime.     hydrocortisone (CORTEF) 10 MG tablet Take 10 mg by mouth 2 (two) times daily.     metoprolol tartrate (LOPRESSOR) 25 MG tablet Take 1 tablet (25 mg total) by mouth 2 (two) times daily. 180 tablet 3   naloxone (NARCAN) nasal spray 4 mg/0.1 mL Place 1 spray into the nose once.     nitroGLYCERIN (NITROSTAT) 0.4 MG SL tablet Place 1 tablet (0.4 mg total) under the tongue  every 5 (five) minutes as needed for chest pain. 25 tablet 3   oxyCODONE-acetaminophen (PERCOCET) 10-325 MG per tablet Take 1 tablet by mouth 4 (four) times daily.     pantoprazole (PROTONIX) 40 MG tablet Take 40 mg by mouth daily.     potassium chloride SA (K-DUR,KLOR-CON) 20 MEQ tablet Take 20 mEq by mouth daily.  11   torsemide (DEMADEX) 20 MG tablet Take 40 mg by mouth 2 (two) times daily.     No current facility-administered medications for this encounter.    No Known Allergies  Social History   Socioeconomic History   Marital status: Widowed    Spouse name: Not on file   Number of children: Not on file   Years of education: Not on file   Highest education level: Not on file  Occupational History   Not on file  Tobacco Use   Smoking status: Every Day    Packs/day: 1.00    Types: Cigarettes   Smokeless tobacco: Never   Tobacco comments:    Former smoker 09/30/21  Vaping Use   Vaping Use: Never used  Substance and Sexual Activity   Alcohol use: No   Drug use: No   Sexual activity: Not on file  Other Topics Concern   Not on file  Social History Narrative   Not on file   Social Determinants of Health   Financial Resource Strain: Not on file  Food Insecurity: Not on file  Transportation Needs: Not on file  Physical Activity: Not on file  Stress: Not on file  Social Connections: Not on file  Intimate Partner Violence: Not on file     ROS- All systems are reviewed and negative except as per the HPI above.  Physical Exam: Vitals:   11/29/21 1142  BP: 112/62  Pulse: 97  Weight: 89.5 kg  Height: 6\' 2"  (1.88 m)    GEN- The patient is a well appearing male, alert and oriented x 3 today.   HEENT-head normocephalic, atraumatic, sclera clear, conjunctiva pink, hearing intact, trachea midline. Lungs- Clear to ausculation bilaterally, normal work of breathing Heart- irregular rate and rhythm, no murmurs, rubs or gallops  GI- soft, NT, ND, + BS Extremities- no  clubbing, cyanosis, or edema MS- no significant deformity or atrophy Skin- no rash or lesion Psych- euthymic mood, full affect Neuro- strength and sensation are intact   Wt Readings from Last 3 Encounters:  11/29/21 89.5 kg  11/25/21 89.6 kg  11/19/21 92.9 kg    EKG today demonstrates  Afib Vent. rate 97 BPM PR interval * ms QRS duration 84 ms QT/QTcB 384/487 ms  Epic records are reviewed at length today  CHA2DS2-VASc Score = 3  The patient's score is based upon: CHF History: 0 HTN History: 1 Diabetes History: 0 Stroke History: 0 Vascular Disease History: 1 Age Score: 1 Gender Score: 0       ASSESSMENT AND  PLAN: 1. Persistent Atrial Fibrillation (ICD10:  I48.19) The patient's CHA2DS2-VASc score is 3, indicating a 3.2% annual risk of stroke.   S/p DCCV 10/12/21 with early return of afib. Patient presents for dofetilide admission. Continue Eliquis 5 mg BID, states no missed doses in the last 3 weeks. No recent benadryl use PharmD has screened medications QTc in SR 435 ms Labs today show creatinine at 1.28, K+ 3.5 and mag 2.2, CrCl calculated at 65 mL/min. K+ will need to be replaced prior to dofetilide initiation.  Continue Lopressor 25 mg BID  2. Secondary Hypercoagulable State (ICD10:  D68.69) The patient is at significant risk for stroke/thromboembolism based upon his CHA2DS2-VASc Score of 3.  Continue Apixaban (Eliquis).   3. CAD S/p CABG and stenting. No anginal symptoms.   To be admitted later today once a bed becomes available.    Jorja Loa PA-C [facility] [address] [place], [place] [address] [phone] 11/29/2021 11:58 AM

## 2021-11-29 NOTE — Plan of Care (Signed)
  Problem: Education: Goal: Knowledge of General Education information will improve Description: Including pain rating scale, medication(s)/side effects and non-pharmacologic comfort measures Outcome: Progressing   Problem: Health Behavior/Discharge Planning: Goal: Ability to manage health-related needs will improve Outcome: Progressing   Problem: Clinical Measurements: Goal: Cardiovascular complication will be avoided Outcome: Progressing   Problem: Activity: Goal: Risk for activity intolerance will decrease Outcome: Progressing   Problem: Safety: Goal: Ability to remain free from injury will improve Outcome: Progressing   

## 2021-11-29 NOTE — H&P (Signed)
Primary Care Physician: [name], NP Primary Cardiologist: Dr Eden Emms Primary Electrophysiologist: Dr Ladona Ridgel Referring Physician: Dr Iven Finn Victor Nelson is a 76 y.o. male with a history of CAD s/p CABG, Addison's disease COPD, tobacco abuse, HTN, HLD, chronic back pain, atrial fibrillation who presents for follow up in the [facility].  The patient was initially diagnosed with atrial fibrillation 09/01/21. He was sent to Dr Eden Emms after patient was found to have an elevated heart rate at a different provider office. He did not have any awareness of his afib at that time. ECG showed arib with HR 154 bpm. He was started on metoprolol for rate control and Eliquis for a CHADS2VASC score of 3. He underwent DCCV on 10/12/21 but had early return of afib. He was seen by Dr Ladona Ridgel 11/19/21 who recommended dofetilide.   On follow up today, patient presents for dofetilide loading. He denies any missed doses of anticoagulation in the past three weeks, has not taken benadryl recently. He does have intermittent dizziness when in afib.    Today, he denies symptoms of palpitations, chest pain, shortness of breath, orthopnea, PND, lower extremity edema, presyncope, syncope, snoring, daytime somnolence, bleeding, or neurologic sequela. The patient is tolerating medications without difficulties and is otherwise without complaint today.      Atrial Fibrillation Risk Factors:   he does not have symptoms or diagnosis of sleep apnea. he does not have a history of rheumatic fever.     he has a BMI of Body mass index is 25.34 kg/m.Marland Kitchen    Filed Weights    11/29/21 1142  Weight: 89.5 kg           Family History  Problem Relation Age of Onset   Liver disease Mother     Stroke Father     Addison's disease Brother     Diabetes Maternal Grandmother     Heart attack Maternal Grandfather     Heart attack Paternal Grandfather     Heart attack Brother          Atrial  Fibrillation Management history:   Previous antiarrhythmic drugs: none Previous cardioversions: 10/12/21 Previous ablations: none CHADS2VASC score: 3 Anticoagulation history: Eliquis         Past Medical History:  Diagnosis Date   Addison's disease (HCC)     Chronic back pain     Coronary artery disease     Dyspnea     GERD (gastroesophageal reflux disease)     Hypercholesterolemia     Hypertension     Myocardial infarct (HCC) 2001         Past Surgical History:  Procedure Laterality Date   APPENDECTOMY       BACK SURGERY       CARDIOVERSION N/A 10/12/2021    Procedure: CARDIOVERSION;  Surgeon: Chrystie Nose, MD;  Location: [facility];  Service: Cardiovascular;  Laterality: N/A;   CHOLECYSTECTOMY       CORONARY ANGIOPLASTY WITH STENT PLACEMENT   04/19/1999   CORONARY ARTERY BYPASS GRAFT   04/19/2003            Current Outpatient Medications  Medication Sig Dispense Refill   albuterol (PROVENTIL HFA;VENTOLIN HFA) 108 (90 BASE) MCG/ACT inhaler Inhale 2 puffs into the lungs every 6 (six) hours as needed for wheezing or shortness of breath. 1 Inhaler 2   apixaban (ELIQUIS) 5 MG TABS tablet Take 1 tablet (5 mg total) by mouth 2 (  two) times daily. 60 tablet 11   aspirin EC 81 MG tablet Take 81 mg by mouth daily.       fexofenadine (ALLEGRA) 180 MG tablet Take 180 mg by mouth daily.       fludrocortisone (FLORINEF) 0.1 MG tablet Take 2 tablets (0.2 mg total) by mouth daily. 180 tablet 1   fluticasone (FLONASE) 50 MCG/ACT nasal spray Place 2 sprays into both nostrils daily as needed for allergies.       gabapentin (NEURONTIN) 300 MG capsule Take 600 mg by mouth at bedtime.       hydrocortisone (CORTEF) 10 MG tablet Take 10 mg by mouth 2 (two) times daily.       metoprolol tartrate (LOPRESSOR) 25 MG tablet Take 1 tablet (25 mg total) by mouth 2 (two) times daily. 180 tablet 3   naloxone (NARCAN) nasal spray 4 mg/0.1 mL Place 1 spray into the nose once.       nitroGLYCERIN  (NITROSTAT) 0.4 MG SL tablet Place 1 tablet (0.4 mg total) under the tongue every 5 (five) minutes as needed for chest pain. 25 tablet 3   oxyCODONE-acetaminophen (PERCOCET) 10-325 MG per tablet Take 1 tablet by mouth 4 (four) times daily.       pantoprazole (PROTONIX) 40 MG tablet Take 40 mg by mouth daily.       potassium chloride SA (K-DUR,KLOR-CON) 20 MEQ tablet Take 20 mEq by mouth daily.   11   torsemide (DEMADEX) 20 MG tablet Take 40 mg by mouth 2 (two) times daily.        No current facility-administered medications for this encounter.      No Known Allergies   Social History         Socioeconomic History   Marital status: Widowed      Spouse name: Not on file   Number of children: Not on file   Years of education: Not on file   Highest education level: Not on file  Occupational History   Not on file  Tobacco Use   Smoking status: Every Day      Packs/day: 1.00      Types: Cigarettes   Smokeless tobacco: Never   Tobacco comments:      Former smoker 09/30/21  Vaping Use   Vaping Use: Never used  Substance and Sexual Activity   Alcohol use: No   Drug use: No   Sexual activity: Not on file  Other Topics Concern   Not on file  Social History Narrative   Not on file    Social Determinants of Health    Financial Resource Strain: Not on file  Food Insecurity: Not on file  Transportation Needs: Not on file  Physical Activity: Not on file  Stress: Not on file  Social Connections: Not on file  Intimate Partner Violence: Not on file        ROS- All systems are reviewed and negative except as per the HPI above.   Physical Exam:    Vitals:    11/29/21 1142  BP: 112/62  Pulse: 97  Weight: 89.5 kg  Height: 6\' 2"  (1.88 m)      GEN- The patient is a well appearing male, alert and oriented x 3 today.   HEENT-head normocephalic, atraumatic, sclera clear, conjunctiva pink, hearing intact, trachea midline. Lungs- Clear to ausculation bilaterally, normal work of  breathing Heart- irregular rate and rhythm, no murmurs, rubs or gallops  GI- soft, NT, ND, + BS Extremities- no clubbing,  cyanosis, or edema MS- no significant deformity or atrophy Skin- no rash or lesion Psych- euthymic mood, full affect Neuro- strength and sensation are intact        Wt Readings from Last 3 Encounters:  11/29/21 89.5 kg  11/25/21 89.6 kg  11/19/21 92.9 kg      EKG today demonstrates  Afib Vent. rate 97 BPM PR interval * ms QRS duration 84 ms QT/QTcB 384/487 ms   Epic records are reviewed at length today   CHA2DS2-VASc Score = 3  The patient's score is based upon: CHF History: 0 HTN History: 1 Diabetes History: 0 Stroke History: 0 Vascular Disease History: 1 Age Score: 1 Gender Score: 0         ASSESSMENT AND PLAN: 1. Persistent Atrial Fibrillation (ICD10:  I48.19) The patient's CHA2DS2-VASc score is 3, indicating a 3.2% annual risk of stroke.   S/p DCCV 10/12/21 with early return of afib. Patient presents for dofetilide admission. Continue Eliquis 5 mg BID, states no missed doses in the last 3 weeks. No recent benadryl use PharmD has screened medications QTc in SR 435 ms Labs today show creatinine at 1.28, K+ 3.5 and mag 2.2, CrCl calculated at 65 mL/min. K+ will need to be replaced prior to dofetilide initiation.  Continue Lopressor 25 mg BID   2. Secondary Hypercoagulable State (ICD10:  D68.69) The patient is at significant risk for stroke/thromboembolism based upon his CHA2DS2-VASc Score of 3.  Continue Apixaban (Eliquis).    3. CAD S/p CABG and stenting. No anginal symptoms.     To be admitted later today once a bed becomes available.      Jorja Loa PA-C Afib Clinic [facility] [address] [place], [place] [address] [phone] 11/29/2021 11:58 AM          ----------------------------  I have seen, examined the patient, and reviewed the above assessment and plan.    Victor Nelson is a 76 year old man  presenting to the hospital for Tikosyn loading.  He has symptomatic persistent atrial fibrillation and a history of coronary artery disease post CABG.  His QTc and creatinine are okay for initial loading dose.  Plan to start tonight.  Risks of Tikosyn loading have been discussed with the patient.  GEN- The patient is well appearing, alert and oriented x 3    HEENT: normocephalic, atraumatic; sclera clear, conjunctiva pink; hearing intact; oropharynx clear; neck supple  Lungs- Clear to ausculation bilaterally, normal work of breathing.  No wheezes, rales, rhonchi Heart-irregularly irregular rhythm, no murmurs, rubs or gallops  GI- soft, non-tender, non-distended, bowel sounds present  Extremities- no clubbing, cyanosis, or edema; DP/PT/radial pulses 2+ bilaterally, groin without hematoma/bruit MS- no significant deformity or atrophy Skin- warm and dry, no rash or lesion Psych- euthymic mood, full affect Neuro- strength and sensation are intact  #Persistent atrial fibrillation Initiate dofetilide loading tonight Continue Eliquis  Lanier Prude, MD 11/29/2021 9:55 PM

## 2021-11-29 NOTE — Progress Notes (Signed)
Received call from CCMD regarding sinus pause of 5.5 seconds. Converted to SB. EKG done.

## 2021-11-29 NOTE — Progress Notes (Signed)
Pharmacy: Dofetilide (Tikosyn) - Initial Consult Assessment and Electrolyte Replacement  Pharmacy consulted to assist in monitoring and replacing electrolytes in this 76 y.o. male admitted on 11/29/2021 undergoing dofetilide initiation. First dofetilide dose bid  - dose ok for renal function : 2200 11/29/21  Assessment:  Patient Exclusion Criteria: If any screening criteria checked as "Yes", then  patient  should NOT receive dofetilide until criteria item is corrected.  If "Yes" please indicate correction plan.  YES  NO Patient  Exclusion Criteria Correction Plan   []   [x]   Baseline QTc interval is greater than or equal to 440 msec. IF above YES box checked dofetilide contraindicated unless patient has ICD; then may proceed if QTc 500-550 msec or with known ventricular conduction abnormalities may proceed with QTc 550-600 msec. QTc =  430 in SR per Afib clinic provider    []   [x]   Patient is known or suspected to have a digoxin level greater than 2 ng/ml: No results found for: "DIGOXIN"     []   [x]   Creatinine clearance less than 20 ml/min (calculated using Cockcroft-Gault, actual body weight and serum creatinine): Estimated Creatinine Clearance: 60.7 mL/min (A) (by C-G formula based on SCr of 1.26 mg/dL (H)).     []   [x]  Patient has received drugs known to prolong the QT intervals within the last 48 hours (phenothiazines, tricyclics or tetracyclic antidepressants, erythromycin, H-1 antihistamines, cisapride, fluoroquinolones, azithromycin, ondansetron).   Updated information on QT prolonging agents is available to be searched on the following database:QT prolonging agents     []   [x]   Patient received a dose of hydrochlorothiazide (Oretic) alone or in any combination including triamterene (Dyazide, Maxzide) in the last 48 hours.    []   [x]  Patient received a medication known to increase dofetilide plasma concentrations prior to initial dofetilide dose:  Trimethoprim  (Primsol, Proloprim) in the last 36 hours Verapamil (Calan, Verelan) in the last 36 hours or a sustained release dose in the last 72 hours Megestrol (Megace) in the last 5 days  Cimetidine (Tagamet) in the last 6 hours Ketoconazole (Nizoral) in the last 24 hours Itraconazole (Sporanox) in the last 48 hours  Prochlorperazine (Compazine) in the last 36 hours     []   [x]   Patient is known to have a history of torsades de pointes; congenital or acquired long QT syndromes.    []   [x]   Patient has received a Class 1 antiarrhythmic with less than 2 half-lives since last dose. (Disopyramide, Quinidine, Procainamide, Lidocaine, Mexiletine, Flecainide, Propafenone)    []   [x]   Patient has received amiodarone therapy in the past 3 months or amiodarone level is greater than 0.3 ng/ml.    Patient has been appropriately anticoagulated with apipxanban 5mg  bid.  Labs:    Component Value Date/Time   K 4.4 11/29/2021 1926   MG 2.2 11/29/2021 1138     Plan: Potassium: K 3.5-3.7:  Hold Tikosyn initiation and give KCl 60 mEq po x1 and repeat BMET 2hr after dose - repeat appropriate dose if K < 4   Follow up K was 4.4  - ok for dofetilide   Magnesium: Mg >2: Appropriate to initiate Tikosyn, no replacement needed    Pharm.D. CPP, BCPS Clinical Pharmacist [phone] 11/29/2021 9:14 PM

## 2021-11-30 ENCOUNTER — Telehealth (HOSPITAL_COMMUNITY): Payer: Self-pay | Admitting: Pharmacy Technician

## 2021-11-30 ENCOUNTER — Other Ambulatory Visit (HOSPITAL_COMMUNITY): Payer: Self-pay

## 2021-11-30 DIAGNOSIS — I4819 Other persistent atrial fibrillation: Secondary | ICD-10-CM | POA: Diagnosis not present

## 2021-11-30 LAB — BASIC METABOLIC PANEL
Anion gap: 6 (ref 5–15)
BUN: 14 mg/dL (ref 8–23)
CO2: 33 mmol/L — ABNORMAL HIGH (ref 22–32)
Calcium: 9.3 mg/dL (ref 8.9–10.3)
Chloride: 99 mmol/L (ref 98–111)
Creatinine, Ser: 1.42 mg/dL — ABNORMAL HIGH (ref 0.61–1.24)
GFR, Estimated: 52 mL/min — ABNORMAL LOW (ref >60)
Glucose, Bld: 101 mg/dL — ABNORMAL HIGH (ref 70–99)
Potassium: 4.7 mmol/L (ref 3.5–5.1)
Sodium: 138 mmol/L (ref 135–145)

## 2021-11-30 LAB — BRAIN NATRIURETIC PEPTIDE: B Natriuretic Peptide: 264.6 pg/mL — ABNORMAL HIGH (ref 0.0–100.0)

## 2021-11-30 LAB — MAGNESIUM: Magnesium: 1.9 mg/dL (ref 1.7–2.4)

## 2021-11-30 MED ORDER — DOFETILIDE 125 MCG PO CAPS
125.0000 ug | ORAL_CAPSULE | Freq: Two times a day (BID) | ORAL | Status: DC
  Filled 2021-11-30: qty 1

## 2021-11-30 MED ORDER — DOFETILIDE 250 MCG PO CAPS
250.0000 ug | ORAL_CAPSULE | Freq: Two times a day (BID) | ORAL | Status: DC
  Administered 2021-11-30: 250 ug via ORAL
  Filled 2021-11-30: qty 1

## 2021-11-30 MED ORDER — OXYCODONE HCL 5 MG PO TABS
5.0000 mg | ORAL_TABLET | Freq: Four times a day (QID) | ORAL | Status: DC | PRN
  Administered 2021-11-30 – 2021-12-01 (×3): 5 mg via ORAL
  Filled 2021-11-30 (×3): qty 1

## 2021-11-30 MED ORDER — MAGNESIUM SULFATE IN D5W 1-5 GM/100ML-% IV SOLN
1.0000 g | Freq: Once | INTRAVENOUS | Status: AC
Start: 2021-11-30 — End: 2021-11-30
  Administered 2021-11-30: 1 g via INTRAVENOUS
  Filled 2021-11-30: qty 100

## 2021-11-30 MED ORDER — OXYCODONE-ACETAMINOPHEN 5-325 MG PO TABS
1.0000 | ORAL_TABLET | Freq: Four times a day (QID) | ORAL | Status: DC | PRN
  Administered 2021-11-30 – 2021-12-01 (×3): 1 via ORAL
  Filled 2021-11-30 (×3): qty 1

## 2021-11-30 MED ORDER — METOPROLOL TARTRATE 12.5 MG HALF TABLET
12.5000 mg | ORAL_TABLET | Freq: Two times a day (BID) | ORAL | Status: DC
  Filled 2021-11-30: qty 1

## 2021-11-30 NOTE — Progress Notes (Signed)
During shift pt had a 6 beat run of v tach. Renee with cardiology was made aware at the time.

## 2021-11-30 NOTE — Telephone Encounter (Signed)
Pharmacy Patient Advocate Encounter  Insurance verification completed.    The patient is insured through [employer] Part D   The patient is currently admitted and ran test claims for the following: dofetitlide (Tikosyn) 250 mcg capsules..  Copays and coinsurance results were relayed to Inpatient clinical team.

## 2021-11-30 NOTE — TOC Benefit Eligibility Note (Signed)
Patient Product/process development scientist completed.    The patient is currently admitted and upon discharge could be taking dofetitlide (Tikosyn) 250 mcg capsules.  The current 30 day co-pay is $95.00.   The patient is insured through [employer] Medicare Part D     Roland Earl, CPhT Pharmacy Patient Advocate Specialist [facility] Patient Advocate Team Direct Number: [phone]  Fax: [phone]

## 2021-11-30 NOTE — Care Management (Signed)
1024 11-30-21 Patient presented for Tikosyn Load. Benefits check submitted for cost. Case Manager will discuss cost and pharmacy of choice as the patient progresses.

## 2021-11-30 NOTE — Progress Notes (Signed)
Progress Note  Patient Name: Victor Nelson Date of Encounter: 11/30/2021  [facility] Cardiologist: Charlton Haws, MD   Subjective   A little "gassy"  low belly, passing gas today, didn't sleep well, no CP, no SOB  Inpatient Medications    Scheduled Meds:  apixaban  5 mg Oral BID   aspirin EC  81 mg Oral Daily   dofetilide  250 mcg Oral BID   fludrocortisone  0.2 mg Oral Daily   gabapentin  600 mg Oral QHS   hydrocortisone  10 mg Oral BID   loratadine  10 mg Oral Daily   metoprolol tartrate  25 mg Oral BID   pantoprazole  40 mg Oral Daily   potassium chloride SA  20 mEq Oral Daily   sodium chloride flush  3 mL Intravenous Q12H   torsemide  40 mg Oral BID   Continuous Infusions:  sodium chloride     PRN Meds: sodium chloride, albuterol, fluticasone, nitroGLYCERIN, mouth rinse, oxyCODONE-acetaminophen **AND** oxyCODONE, sodium chloride flush   Vital Signs    Vitals:   11/29/21 1734 11/29/21 2106 11/30/21 0044 11/30/21 0334  BP: 111/66 110/62 (!) 100/58 102/61  Pulse: 79 77 66 67  Resp: 15 19 19 19   Temp: 97.7 F (36.5 C) 97.8 F (36.6 C)  97.8 F (36.6 C)  TempSrc: Oral Oral  Oral  SpO2: 100% 97% 92% 95%    Intake/Output Summary (Last 24 hours) at 11/30/2021 0824 Last data filed at 11/30/2021 0300 Gross per 24 hour  Intake 480 ml  Output --  Net 480 ml      11/29/2021   11:42 AM 11/25/2021    3:18 PM 11/19/2021   12:15 PM  Last 3 Weights  Weight (lbs) 197 lb 6.4 oz 197 lb 9.6 oz 204 lb 12.8 oz  Weight (kg) 89.54 kg 89.631 kg 92.897 kg      Telemetry    AF > SR/SB 50's mostly some transient rates slower, some 60's or so as well. - Personally Reviewed  ECG    SB 57bpm, PACs, manually neasured QT 01/19/2022 QTc 507 - Personally Reviewed  Physical Exam   GEN: No acute distress.   Neck: No JVD Cardiac: RRR, no murmurs, rubs, or gallops.  Respiratory: CTA b/l. GI: Soft, nontender, non-distended  MS:  trace edema; No deformity. Neuro:  Nonfocal   Psych: Normal affect   Labs    High Sensitivity Troponin:  No results for input(s): "TROPONINIHS" in the last 720 hours.   Chemistry Recent Labs  Lab 11/29/21 1138 11/29/21 1926 11/30/21 0326  NA 137 139 138  K 3.5 4.4 4.7  CL 105 103 99  CO2 23 31 33*  GLUCOSE 79 97 101*  BUN 15 13 14   CREATININE 1.28* 1.26* 1.42*  CALCIUM 8.9 9.0 9.3  MG 2.2  --  1.9  GFRNONAA 59* >60 52*  ANIONGAP 9 5 6     Lipids No results for input(s): "CHOL", "TRIG", "HDL", "LABVLDL", "LDLCALC", "CHOLHDL" in the last 168 hours.  HematologyNo results for input(s): "WBC", "RBC", "HGB", "HCT", "MCV", "MCH", "MCHC", "RDW", "PLT" in the last 168 hours. Thyroid No results for input(s): "TSH", "FREET4" in the last 168 hours.  BNPNo results for input(s): "BNP", "PROBNP" in the last 168 hours.  DDimer No results for input(s): "DDIMER" in the last 168 hours.   Radiology    No results found.  Cardiac Studies   01/21/21 [place] echo LVEF 58%, no significant VHD LA 20mm  Patient Profile  76 y.o. male w.PMHx of CAD (CABG 2005), Addison's disease/adrenal insufficiency on steroids, chronic opioid dependence/chronic back pain, Obesity, COPD, AFib, HTN, HLD admitted for Tikosyn initiation  Pt was referred to Dr. Ladona Ridgel for persistent AFib and rhythm management strategies.  Not an ablation candidate felt Tikosyn his best AAD option. He saw AFib clinic, meds reviewed by [facility], admitted yesterday for Tikosyn  Assessment & Plan    Persistent AFib CHA2DS2Vasc is 3, on Eliquis, appropriately dose Pt confirmed prior to admission no missed doses of OAC K+ 4.7 Mag 1.9 Creat 1.42 (calc CrCl 59) typically better QTC is a little long in SR reviewed with Dr. Lalla Brothers will reduce his dose  Had a 5.5 sec post conversion pause, HRs transiently into the low 50's for now planned to continue his home lopressor  CAD No anginal complaints Home meds  HTN Home meds Looks OK  Chronic back pain/chronic opioid  use/dependence Percocet by his home meds is scheduled Q6, will make PRN Also on gabapentin Suspect this is more the cause of his dizzy spells then the AFib, perhaps multifactorial Reviewed record, d/w patient   For questions or updates, please contact [facility] Please consult [email or web address] for contact info under        Signed, Sheilah Pigeon, PA-C  11/30/2021, 8:24 AM

## 2021-11-30 NOTE — Progress Notes (Signed)
post dose EKG reviewed with Dr. Lalla Brothers QTc again long on reduced Tikosyn dose Will give no dose tonight EKG in AM Plan for dose tomorrow D/w RN  HRs continue to drop to wards 30s Reduced lopressor with hold parameters  Francis Dowse, PA-C

## 2021-12-01 DIAGNOSIS — I4819 Other persistent atrial fibrillation: Secondary | ICD-10-CM | POA: Diagnosis not present

## 2021-12-01 LAB — BASIC METABOLIC PANEL
Anion gap: 10 (ref 5–15)
BUN: 15 mg/dL (ref 8–23)
CO2: 32 mmol/L (ref 22–32)
Calcium: 9.2 mg/dL (ref 8.9–10.3)
Chloride: 94 mmol/L — ABNORMAL LOW (ref 98–111)
Creatinine, Ser: 1.49 mg/dL — ABNORMAL HIGH (ref 0.61–1.24)
GFR, Estimated: 49 mL/min — ABNORMAL LOW (ref >60)
Glucose, Bld: 95 mg/dL (ref 70–99)
Potassium: 4.4 mmol/L (ref 3.5–5.1)
Sodium: 136 mmol/L (ref 135–145)

## 2021-12-01 LAB — MAGNESIUM: Magnesium: 2 mg/dL (ref 1.7–2.4)

## 2021-12-01 NOTE — Discharge Summary (Addendum)
ELECTROPHYSIOLOGY PROCEDURE DISCHARGE SUMMARY    Patient ID: Victor Nelson,  MRN: [id number], DOB/AGE: 04-18-1946 76 y.o.  Admit date: 11/29/2021 Discharge date: 12/01/2021  Primary Care Physician: Emelia Loron, NP  Primary Cardiologist: Dr. Johnsie Cancel Electrophysiologist: Dr. Lovena Le  Primary Discharge Diagnosis:  1.  persistent atrial fibrillation status post Tikosyn loading this admission      CHA2DS2Vasc is 3, on Eliquis  Secondary Discharge Diagnosis:  CAD CABG 2005 HTN Chronic back pain Chronic opioid use/dependence COPD Addison's disease Adrenal insufficiency  No Known Allergies   Procedures This Admission:  1.  Tikosyn loading, aborted   Brief HPI: Victor Nelson is a 76 y.o. male with a past medical history as noted above.  They were referred to EP in the outpatient setting for treatment options of atrial fibrillation.  Risks, benefits, and alternatives to Tikosyn were reviewed with the patient who wished to proceed.    Hospital Course:  The patient was admitted and Tikosyn was initiated.  Renal function and electrolytes were followed during the hospitalization.  The patient converted with drub, unfortunately his QTc lengthened and remained tool long to pursue drug.   He was monitored until discharge on telemetry which demonstrated SB mostly 40's, does dip into the 30's with sleep, gets to the 60's as well, no heart block, no symptoms of bradycardia, he has been ambulating in the room without difficulty or symptoms.  I had him walk with brisk rise in his HR to 60's.  His home lopressor is being stopped.  Will aslo stop his ASA  given Eliquis and remote CABG only.  On the day of discharge, outside of his chronic back pain he feels well, was examined by Dr Victor Nelson who considered the patient stable for discharge to home.  Follow-up has been arranged with the AFib next week to start amiodarone and, further follow up pending that visit.  Creat up from his baseline,  advised to keep adequately hydrated. Will have BMET done at his visit Monday Afib clinic   Physical Exam: Vitals:   11/30/21 2040 11/30/21 2111 12/01/21 0449 12/01/21 0853  BP: (!) 108/51 106/71 112/64 (!) 113/53  Pulse: 63 71 (!) 56 (!) 52  Resp: _0 Temp: 98.2 F (36.8 C) 97.6 F (36.4 C) (!) 97.2 F (36.2 C) (!) 97.3 F (36.3 C)  TempSrc: Axillary Oral Oral Oral  SpO2: 94% 93% 97% 95%  Weight:   87.3 kg      GEN- The patient is well appearing, alert and oriented x 3 today.   HEENT: normocephalic, atraumatic; sclera clear, conjunctiva pink; hearing intact; oropharynx clear; neck supple, no JVP Lymph- no cervical lymphadenopathy Lungs- CTA b/l, normal work of breathing.  No wheezes, rales, rhonchi Heart- RRR, bradycardic, no murmurs, rubs or gallops, PMI not laterally displaced GI- soft, non-tender, non-distended Extremities- no clubbing, cyanosis, or edema MS- no significant deformity or atrophy Skin- warm and dry, no rash or lesion Psych- euthymic mood, full affect Neuro- strength and sensation are intact   Labs:   Lab Results  Component Value Date   WBC 6.5 09/30/2021   HGB 12.6 (L) 09/30/2021   HCT 38.3 (L) 09/30/2021   MCV 88.0 09/30/2021   PLT 186 09/30/2021    Recent Labs  Lab 12/01/21 0433  NA 136  K 4.4  CL 94*  CO2 32  BUN 15  CREATININE 1.49*  CALCIUM 9.2  GLUCOSE 95     Discharge Medications:  Allergies as of  12/01/2021   No Known Allergies      Medication List     STOP taking these medications    aspirin EC 81 MG tablet   metoprolol tartrate 25 MG tablet Commonly known as: LOPRESSOR       TAKE these medications    albuterol 108 (90 Base) MCG/ACT inhaler Commonly known as: VENTOLIN HFA Inhale 2 puffs into the lungs every 6 (six) hours as needed for wheezing or shortness of breath.   apixaban 5 MG Tabs tablet Commonly known as: ELIQUIS Take 1 tablet (5 mg total) by mouth 2 (two) times daily.   fexofenadine 180  MG tablet Commonly known as: ALLEGRA Take 180 mg by mouth daily.   fludrocortisone 0.1 MG tablet Commonly known as: FLORINEF Take 2 tablets (0.2 mg total) by mouth daily.   fluticasone 50 MCG/ACT nasal spray Commonly known as: FLONASE Place 2 sprays into both nostrils daily as needed for allergies.   gabapentin 300 MG capsule Commonly known as: NEURONTIN Take 600 mg by mouth at bedtime.   hydrocortisone 10 MG tablet Commonly known as: CORTEF Take 10 mg by mouth 2 (two) times daily.   naloxone 4 MG/0.1ML Liqd nasal spray kit Commonly known as: NARCAN Place 1 spray into the nose daily as needed (for overdose).   nitroGLYCERIN 0.4 MG SL tablet Commonly known as: NITROSTAT Place 1 tablet (0.4 mg total) under the tongue every 5 (five) minutes as needed for chest pain.   oxyCODONE-acetaminophen 10-325 MG tablet Commonly known as: PERCOCET Take 1 tablet by mouth 4 (four) times daily.   pantoprazole 40 MG tablet Commonly known as: PROTONIX Take 40 mg by mouth daily.   potassium chloride SA 20 MEQ tablet Commonly known as: KLOR-CON M Take 20 mEq by mouth daily.   torsemide 20 MG tablet Commonly known as: DEMADEX Take 40 mg by mouth 2 (two) times daily.        Disposition: Home  Discharge Instructions     Diet - low sodium heart healthy   Complete by: As directed    Increase activity slowly   Complete by: As directed        Follow-up Information     [facility].   Specialty: Cardiology Why: Roderic Palau, NP 12/06/21 @ 10:00AM Contact information: [address] [id number] [place] [address] [phone]                Duration of Discharge Encounter: Greater than 30 minutes including physician time.  Venetia Night, PA-C 12/01/2021 9:57 AM

## 2021-12-01 NOTE — Progress Notes (Addendum)
Pharmacy: Dofetilide (Tikosyn) - Follow Up Assessment and Electrolyte Replacement  Pharmacy consulted to assist in monitoring and replacing electrolytes in this 76 y.o. male admitted on 11/29/2021 undergoing dofetilide initiation.  Labs:    Component Value Date/Time   K 4.4 12/01/2021 0433   MG 2.0 12/01/2021 0433     Plan: Potassium: K >/= 4: No additional supplementation needed  Magnesium: -Patient for discharge. Will not supplement with Mg= 2.0   Thank you for allowing pharmacy to participate in this patient's care   Harland German, PharmD Clinical Pharmacist **Pharmacist phone directory can now be found on [email or web address] ([email or web address]).  Listed under [facility].

## 2021-12-01 NOTE — Progress Notes (Signed)
Pt was complaining of 10/10 chronic back pain, HR sustaining in the 40's, & p.m.Metoprolol dose previously held. Anne Hahn, MD paged and made aware; verbal permission to administer analgesic was given. Will continue to monitor.   Josefine Class, RN

## 2021-12-03 ENCOUNTER — Ambulatory Visit (HOSPITAL_COMMUNITY): Payer: Medicare HMO | Admitting: Physician Assistant

## 2021-12-06 ENCOUNTER — Ambulatory Visit (HOSPITAL_COMMUNITY)
Admission: RE | Admit: 2021-12-06 | Discharge: 2021-12-06 | Disposition: A | Discharge status: Home / Self Care | Payer: Medicare HMO | Source: Ambulatory Visit | Attending: Nurse Practitioner | Admitting: Nurse Practitioner

## 2021-12-06 ENCOUNTER — Ambulatory Visit (HOSPITAL_COMMUNITY): Payer: Medicare HMO | Admitting: Nurse Practitioner

## 2021-12-06 ENCOUNTER — Encounter (HOSPITAL_COMMUNITY): Payer: Self-pay | Admitting: Nurse Practitioner

## 2021-12-06 VITALS — BP 104/54 | HR 155 | Ht 74.0 in | Wt 196.8 lb

## 2021-12-06 DIAGNOSIS — E271 Primary adrenocortical insufficiency: Secondary | ICD-10-CM | POA: Diagnosis not present

## 2021-12-06 DIAGNOSIS — J449 Chronic obstructive pulmonary disease, unspecified: Secondary | ICD-10-CM | POA: Diagnosis not present

## 2021-12-06 DIAGNOSIS — I2510 Atherosclerotic heart disease of native coronary artery without angina pectoris: Secondary | ICD-10-CM | POA: Insufficient documentation

## 2021-12-06 DIAGNOSIS — I10 Essential (primary) hypertension: Secondary | ICD-10-CM | POA: Diagnosis not present

## 2021-12-06 DIAGNOSIS — E785 Hyperlipidemia, unspecified: Secondary | ICD-10-CM | POA: Diagnosis not present

## 2021-12-06 DIAGNOSIS — G8929 Other chronic pain: Secondary | ICD-10-CM | POA: Diagnosis not present

## 2021-12-06 DIAGNOSIS — M549 Dorsalgia, unspecified: Secondary | ICD-10-CM | POA: Diagnosis not present

## 2021-12-06 DIAGNOSIS — Z720 Tobacco use: Secondary | ICD-10-CM | POA: Diagnosis not present

## 2021-12-06 DIAGNOSIS — Z951 Presence of aortocoronary bypass graft: Secondary | ICD-10-CM | POA: Diagnosis not present

## 2021-12-06 DIAGNOSIS — D6869 Other thrombophilia: Secondary | ICD-10-CM | POA: Diagnosis not present

## 2021-12-06 DIAGNOSIS — I4819 Other persistent atrial fibrillation: Secondary | ICD-10-CM | POA: Diagnosis present

## 2021-12-06 LAB — BASIC METABOLIC PANEL
Anion gap: 8 (ref 5–15)
BUN: 16 mg/dL (ref 8–23)
CO2: 29 mmol/L (ref 22–32)
Calcium: 9.4 mg/dL (ref 8.9–10.3)
Chloride: 98 mmol/L (ref 98–111)
Creatinine, Ser: 1.69 mg/dL — ABNORMAL HIGH (ref 0.61–1.24)
GFR, Estimated: 42 mL/min — ABNORMAL LOW (ref >60)
Glucose, Bld: 109 mg/dL — ABNORMAL HIGH (ref 70–99)
Potassium: 4 mmol/L (ref 3.5–5.1)
Sodium: 135 mmol/L (ref 135–145)

## 2021-12-06 MED ORDER — AMIODARONE HCL 200 MG PO TABS: ORAL_TABLET | ORAL | 0 refills | Status: DC

## 2021-12-06 NOTE — Progress Notes (Signed)
Primary Care Physician: [name], NP Primary Cardiologist: Dr Eden Emms Primary Electrophysiologist: Dr Ladona Ridgel Referring Physician: Dr Iven Finn Victor Nelson is a 76 y.o. male with a history of CAD s/p CABG, Addison's disease COPD, tobacco abuse, HTN, HLD, chronic back pain, atrial fibrillation who presents for follow up in the [facility].  The patient was initially diagnosed with atrial fibrillation 09/01/21. He was sent to Dr Eden Emms after patient was found to have an elevated heart rate at a different provider office. He did not have any awareness of his afib at that time. ECG showed arib with HR 154 bpm. He was started on metoprolol for rate control and Eliquis for a CHADS2VASC score of 3. He underwent DCCV on 10/12/21 but had early return of afib. He was seen by Dr Ladona Ridgel 11/19/21 who recommended dofetilide.  On follow up today, patient presents for dofetilide loading. He denies any missed doses of anticoagulation in the past three weeks, has not taken benadryl recently. He does have intermittent dizziness when in afib.   F/u in afib clinic, 12/06/21, he failed dofetilide loading last week  for prolonged qt. It was decided to stop tikosyn and arrange f/u in afib clinic for amiodarone loading this week. EKG shows  afib at 155 bpm. He states he had to walk a along distance form parking garage today. Pulse Ox HR was in 120's after resting. He reports he has been feeling  well since leaving the hospital. Amiodarone loading described to pt and  daughter(on phone). His BB was stopped in the hospital for bradycardia in SR in the 40's.   Today, he denies symptoms of palpitations, chest pain, shortness of breath, orthopnea, PND, lower extremity edema, presyncope, syncope, snoring, daytime somnolence, bleeding, or neurologic sequela. The patient is tolerating medications without difficulties and is otherwise without complaint today.    Atrial Fibrillation Risk Factors:  he  does not have symptoms or diagnosis of sleep apnea. he does not have a history of rheumatic fever.   he has a BMI of Body mass index is 25.27 kg/m.Marland Kitchen Filed Weights   12/06/21 1138  Weight: 89.3 kg    Family History  Problem Relation Age of Onset   Liver disease Mother    Stroke Father    Addison's disease Brother    Diabetes Maternal Grandmother    Heart attack Maternal Grandfather    Heart attack Paternal Grandfather    Heart attack Brother      Atrial Fibrillation Management history:  Previous antiarrhythmic drugs: none Previous cardioversions: 10/12/21 Previous ablations: none CHADS2VASC score: 3 Anticoagulation history: Eliquis   Past Medical History:  Diagnosis Date   Addison's disease (HCC)    Chronic back pain    Coronary artery disease    Dyspnea    GERD (gastroesophageal reflux disease)    Hypercholesterolemia    Hypertension    Myocardial infarct (HCC) 2001   Past Surgical History:  Procedure Laterality Date   APPENDECTOMY     BACK SURGERY     CARDIOVERSION N/A 10/12/2021   Procedure: CARDIOVERSION;  Surgeon: Chrystie Nose, MD;  Location: [facility];  Service: Cardiovascular;  Laterality: N/A;   CHOLECYSTECTOMY     CORONARY ANGIOPLASTY WITH STENT PLACEMENT  04/19/1999   CORONARY ARTERY BYPASS GRAFT  04/19/2003    Current Outpatient Medications  Medication Sig Dispense Refill   albuterol (PROVENTIL HFA;VENTOLIN HFA) 108 (90 BASE) MCG/ACT inhaler Inhale 2 puffs into the lungs every 6 (six) hours  as needed for wheezing or shortness of breath. 1 Inhaler 2   amiodarone (PACERONE) 200 MG tablet Take 2 tablets (400 mg total) by mouth 2 (two) times daily for 3 days, THEN 1 tablet (200 mg total) 2 (two) times daily for 30 days, THEN 1 tablet (200 mg total) daily. 70 tablet 0   apixaban (ELIQUIS) 5 MG TABS tablet Take 1 tablet (5 mg total) by mouth 2 (two) times daily. 60 tablet 11   fexofenadine (ALLEGRA) 180 MG tablet Take 180 mg by mouth daily.      fludrocortisone (FLORINEF) 0.1 MG tablet Take 2 tablets (0.2 mg total) by mouth daily. 180 tablet 1   fluticasone (FLONASE) 50 MCG/ACT nasal spray Place 2 sprays into both nostrils daily as needed for allergies.     gabapentin (NEURONTIN) 300 MG capsule Take 600 mg by mouth at bedtime.     hydrocortisone (CORTEF) 10 MG tablet Take 10 mg by mouth 2 (two) times daily.     naloxone (NARCAN) nasal spray 4 mg/0.1 mL Place 1 spray into the nose daily as needed (for overdose).     nitroGLYCERIN (NITROSTAT) 0.4 MG SL tablet Place 1 tablet (0.4 mg total) under the tongue every 5 (five) minutes as needed for chest pain. 25 tablet 3   oxyCODONE-acetaminophen (PERCOCET) 10-325 MG per tablet Take 1 tablet by mouth 4 (four) times daily.     pantoprazole (PROTONIX) 40 MG tablet Take 40 mg by mouth daily.     potassium chloride SA (K-DUR,KLOR-CON) 20 MEQ tablet Take 20 mEq by mouth daily.  11   torsemide (DEMADEX) 20 MG tablet Take 40 mg by mouth 2 (two) times daily.     No current facility-administered medications for this encounter.    No Known Allergies  Social History   Socioeconomic History   Marital status: Widowed    Spouse name: Not on file   Number of children: Not on file   Years of education: Not on file   Highest education level: Not on file  Occupational History   Not on file  Tobacco Use   Smoking status: Every Day    Packs/day: 1.00    Types: Cigarettes   Smokeless tobacco: Never   Tobacco comments:    Former smoker 09/30/21  Vaping Use   Vaping Use: Never used  Substance and Sexual Activity   Alcohol use: No   Drug use: No   Sexual activity: Not on file  Other Topics Concern   Not on file  Social History Narrative   Not on file   Social Determinants of Health   Financial Resource Strain: Not on file  Food Insecurity: Not on file  Transportation Needs: Not on file  Physical Activity: Not on file  Stress: Not on file  Social Connections: Not on file  Intimate Partner  Violence: Not on file     ROS- All systems are reviewed and negative except as per the HPI above.  Physical Exam: Vitals:   12/06/21 1138  BP: (!) 104/54  Pulse: (!) 155  Weight: 89.3 kg  Height: 6\' 2"  (1.88 m)    GEN- The patient is a well appearing male, alert and oriented x 3 today.   HEENT-head normocephalic, atraumatic, sclera clear, conjunctiva pink, hearing intact, trachea midline. Lungs- Clear to ausculation bilaterally, normal work of breathing Heart- irregular rate and rhythm, no murmurs, rubs or gallops  GI- soft, NT, ND, + BS Extremities- no clubbing, cyanosis, or edema MS- no significant deformity or  atrophy Skin- no rash or lesion Psych- euthymic mood, full affect Neuro- strength and sensation are intact   Wt Readings from Last 3 Encounters:  12/06/21 89.3 kg  12/01/21 87.3 kg  11/29/21 89.5 kg    EKG today demonstrates   Vent. rate 155 BPM PR interval * ms QRS duration 86 ms QT/QTcB 306/491 ms P-R-T axes * -31 79 Atrial flutter with variable A-V block Left axis deviation Nonspecific T wave abnormality Abnormal ECG When compared with ECG of 01-Dec-2021 05:42, PREVIOUS ECG IS PRESENT  Epic records are reviewed at length today  CHA2DS2-VASc Score = 3  The patient's score is based upon: CHF History: 0 HTN History: 1 Diabetes History: 0 Stroke History: 0 Vascular Disease History: 1 Age Score: 1 Gender Score: 0       ASSESSMENT AND PLAN: 1. Persistent Atrial Fibrillation (ICD10:  I48.19) The patient's CHA2DS2-VASc score is 3, indicating a 3.2% annual risk of stroke.   S/p DCCV 10/12/21 with early return of afib. Patient presented for dofetilide admission but failed dofetilide for prolonged qt. It was recommended that he f/u here for amiodarone loading. Risk vrs benefit described to daughter and pt and he is in agreement to start drug Start amiodarone 200 mg, 2 in am and 2 in pm x 3 days, then 200 mg bid.  Tsh/ liver enzymes normal when  recently checked Chest xray 2016 did not show any interstitial  disease  If v rates still elevated may need low dose BB added back in (stopped in hospital for bradycardia in SR in the 40's) Continue Eliquis 5 mg BID, states no missed doses in the last 3 weeks. QTc in SR 435 ms   2. Secondary Hypercoagulable State (ICD10:  D68.69) The patient is at significant risk for stroke/thromboembolism based upon his CHA2DS2-VASc Score of 3.  Continue Apixaban (Eliquis).   3. CAD S/p CABG and stenting. No anginal symptoms.  F/u here in 5 days for ekg, Chest xray to be  updated on return  Then f/u with Jorja Loa, PA in 2 weeks   Lupita Leash C. Matthew Folks [facility] [address] [place], [place] [address] [phone]

## 2021-12-06 NOTE — Patient Instructions (Signed)
Start Amiodarone  --- 400mg  twice a day for the next 3 days then reduce to 200mg  twice a day for the next month then 200mg  once a day  Take with food

## 2021-12-10 ENCOUNTER — Ambulatory Visit (HOSPITAL_COMMUNITY)
Admission: RE | Admit: 2021-12-10 | Discharge: 2021-12-10 | Disposition: A | Discharge status: Home / Self Care | Payer: Medicare HMO | Source: Ambulatory Visit | Attending: Internal Medicine | Admitting: Internal Medicine

## 2021-12-10 VITALS — HR 122

## 2021-12-10 DIAGNOSIS — I4819 Other persistent atrial fibrillation: Secondary | ICD-10-CM | POA: Diagnosis present

## 2021-12-10 MED ORDER — METOPROLOL TARTRATE 25 MG PO TABS: 12.5000 mg | ORAL_TABLET | Freq: Two times a day (BID) | ORAL | 3 refills | Status: DC

## 2021-12-10 NOTE — Patient Instructions (Signed)
Start metoprolol 1/2 tablet twice a day (12.5mg  twice a day)

## 2021-12-13 ENCOUNTER — Telehealth (HOSPITAL_COMMUNITY): Payer: Self-pay | Admitting: *Deleted

## 2021-12-13 NOTE — Telephone Encounter (Signed)
Patient states since restarting metoprolol on Friday afternoon it was causing severe dizziness. Pt stopped and dizziness subsided he has not taken metoprolol since. He is unsure of his heart rates. Follow up next week as scheduled pt does not feel his heart racing as it was last week.

## 2021-12-21 ENCOUNTER — Ambulatory Visit (HOSPITAL_COMMUNITY)
Admission: RE | Admit: 2021-12-21 | Discharge: 2021-12-21 | Disposition: A | Discharge status: Home / Self Care | Payer: Medicare HMO | Source: Ambulatory Visit | Attending: Nurse Practitioner | Admitting: Nurse Practitioner

## 2021-12-21 ENCOUNTER — Ambulatory Visit (HOSPITAL_COMMUNITY)
Admission: RE | Admit: 2021-12-21 | Discharge: 2021-12-21 | Disposition: A | Discharge status: Home / Self Care | Payer: Medicare HMO | Source: Ambulatory Visit | Attending: Physician Assistant | Admitting: Physician Assistant

## 2021-12-21 VITALS — BP 108/56 | HR 117 | Ht 74.0 in | Wt 197.6 lb

## 2021-12-21 DIAGNOSIS — Z7901 Long term (current) use of anticoagulants: Secondary | ICD-10-CM | POA: Insufficient documentation

## 2021-12-21 DIAGNOSIS — I4819 Other persistent atrial fibrillation: Secondary | ICD-10-CM | POA: Diagnosis not present

## 2021-12-21 DIAGNOSIS — D6869 Other thrombophilia: Secondary | ICD-10-CM | POA: Insufficient documentation

## 2021-12-21 DIAGNOSIS — I10 Essential (primary) hypertension: Secondary | ICD-10-CM | POA: Insufficient documentation

## 2021-12-21 DIAGNOSIS — E271 Primary adrenocortical insufficiency: Secondary | ICD-10-CM | POA: Diagnosis not present

## 2021-12-21 DIAGNOSIS — J449 Chronic obstructive pulmonary disease, unspecified: Secondary | ICD-10-CM | POA: Diagnosis not present

## 2021-12-21 DIAGNOSIS — Z951 Presence of aortocoronary bypass graft: Secondary | ICD-10-CM | POA: Insufficient documentation

## 2021-12-21 DIAGNOSIS — I2510 Atherosclerotic heart disease of native coronary artery without angina pectoris: Secondary | ICD-10-CM | POA: Diagnosis not present

## 2021-12-21 DIAGNOSIS — Z79899 Other long term (current) drug therapy: Secondary | ICD-10-CM | POA: Insufficient documentation

## 2021-12-21 DIAGNOSIS — F17200 Nicotine dependence, unspecified, uncomplicated: Secondary | ICD-10-CM | POA: Diagnosis not present

## 2021-12-21 DIAGNOSIS — E785 Hyperlipidemia, unspecified: Secondary | ICD-10-CM | POA: Insufficient documentation

## 2021-12-21 DIAGNOSIS — G8929 Other chronic pain: Secondary | ICD-10-CM | POA: Insufficient documentation

## 2021-12-21 LAB — CBC
HCT: 34.8 % — ABNORMAL LOW (ref 39.0–52.0)
Hemoglobin: 12.5 g/dL — ABNORMAL LOW (ref 13.0–17.0)
MCH: 30.6 pg (ref 26.0–34.0)
MCHC: 35.9 g/dL (ref 30.0–36.0)
MCV: 85.1 fL (ref 80.0–100.0)
Platelets: 189 10*3/uL (ref 150–400)
RBC: 4.09 MIL/uL — ABNORMAL LOW (ref 4.22–5.81)
RDW: 16.1 % — ABNORMAL HIGH (ref 11.5–15.5)
WBC: 7 10*3/uL (ref 4.0–10.5)
nRBC: 0 % (ref 0.0–0.2)

## 2021-12-21 LAB — BASIC METABOLIC PANEL
Anion gap: 12 (ref 5–15)
BUN: 14 mg/dL (ref 8–23)
CO2: 28 mmol/L (ref 22–32)
Calcium: 9.4 mg/dL (ref 8.9–10.3)
Chloride: 96 mmol/L — ABNORMAL LOW (ref 98–111)
Creatinine, Ser: 1.76 mg/dL — ABNORMAL HIGH (ref 0.61–1.24)
GFR, Estimated: 40 mL/min — ABNORMAL LOW (ref >60)
Glucose, Bld: 103 mg/dL — ABNORMAL HIGH (ref 70–99)
Potassium: 3.8 mmol/L (ref 3.5–5.1)
Sodium: 136 mmol/L (ref 135–145)

## 2021-12-21 NOTE — H&P (View-Only) (Signed)
Primary Care Physician: [name], NP Primary Cardiologist: Dr Eden Emms Primary Electrophysiologist: Dr Ladona Ridgel Referring Physician: Dr Iven Finn Victor Nelson is a 76 y.o. male with a history of CAD s/p CABG, Addison's disease COPD, tobacco abuse, HTN, HLD, chronic back pain, atrial fibrillation who presents for follow up in the [facility].  The patient was initially diagnosed with atrial fibrillation 09/01/21. He was sent to Dr Eden Emms after patient was found to have an elevated heart rate at a different provider office. He did not have any awareness of his afib at that time. ECG showed arib with HR 154 bpm. He was started on metoprolol for rate control and Eliquis for a CHADS2VASC score of 3. He underwent DCCV on 10/12/21 but had early return of afib. He was seen by Dr Ladona Ridgel 11/19/21 who recommended dofetilide. Unfortunately, he failed dofetilide due to prolonged QT and was started on amiodarone. On follow up ECG his heart rates remained elevated and his BB was resumed. He developed dizziness and he discontinued the metoprolol with resolution of his symptoms.   On follow up today, patient remains in afib with suboptimal rate control. Despite being in afib, he feels well today. He denies any missed doses of anticoagulation. He is tolerating the medication without difficulty.   Today, he denies symptoms of palpitations, chest pain, shortness of breath, orthopnea, PND, lower extremity edema, presyncope, syncope, snoring, daytime somnolence, bleeding, or neurologic sequela. The patient is tolerating medications without difficulties and is otherwise without complaint today.    Atrial Fibrillation Risk Factors:  he does not have symptoms or diagnosis of sleep apnea. he does not have a history of rheumatic fever.   he has a BMI of Body mass index is 25.37 kg/m.Marland Kitchen Filed Weights   12/21/21 1522  Weight: 89.6 kg    Family History  Problem Relation Age of Onset    Liver disease Mother    Stroke Father    Addison's disease Brother    Diabetes Maternal Grandmother    Heart attack Maternal Grandfather    Heart attack Paternal Grandfather    Heart attack Brother      Atrial Fibrillation Management history:  Previous antiarrhythmic drugs: dofetilide, amiodarone  Previous cardioversions: 10/12/21 Previous ablations: none CHADS2VASC score: 3 Anticoagulation history: Eliquis   Past Medical History:  Diagnosis Date   Addison's disease (HCC)    Chronic back pain    Coronary artery disease    Dyspnea    GERD (gastroesophageal reflux disease)    Hypercholesterolemia    Hypertension    Myocardial infarct (HCC) 2001   Past Surgical History:  Procedure Laterality Date   APPENDECTOMY     BACK SURGERY     CARDIOVERSION N/A 10/12/2021   Procedure: CARDIOVERSION;  Surgeon: Chrystie Nose, MD;  Location: [facility];  Service: Cardiovascular;  Laterality: N/A;   CHOLECYSTECTOMY     CORONARY ANGIOPLASTY WITH STENT PLACEMENT  04/19/1999   CORONARY ARTERY BYPASS GRAFT  04/19/2003    Current Outpatient Medications  Medication Sig Dispense Refill   albuterol (PROVENTIL HFA;VENTOLIN HFA) 108 (90 BASE) MCG/ACT inhaler Inhale 2 puffs into the lungs every 6 (six) hours as needed for wheezing or shortness of breath. 1 Inhaler 2   amiodarone (PACERONE) 200 MG tablet Take 2 tablets (400 mg total) by mouth 2 (two) times daily for 3 days, THEN 1 tablet (200 mg total) 2 (two) times daily for 30 days, THEN 1 tablet (200 mg total) daily. 70  tablet 0   apixaban (ELIQUIS) 5 MG TABS tablet Take 1 tablet (5 mg total) by mouth 2 (two) times daily. 60 tablet 11   fexofenadine (ALLEGRA) 180 MG tablet Take 180 mg by mouth daily.     fludrocortisone (FLORINEF) 0.1 MG tablet Take 2 tablets (0.2 mg total) by mouth daily. 180 tablet 1   fluticasone (FLONASE) 50 MCG/ACT nasal spray Place 2 sprays into both nostrils daily as needed for allergies.     gabapentin (NEURONTIN) 300  MG capsule Take 600 mg by mouth at bedtime.     hydrocortisone (CORTEF) 10 MG tablet Take 10 mg by mouth 2 (two) times daily.     naloxone (NARCAN) nasal spray 4 mg/0.1 mL Place 1 spray into the nose daily as needed (for overdose).     nitroGLYCERIN (NITROSTAT) 0.4 MG SL tablet Place 1 tablet (0.4 mg total) under the tongue every 5 (five) minutes as needed for chest pain. 25 tablet 3   oxyCODONE-acetaminophen (PERCOCET) 10-325 MG per tablet Take 1 tablet by mouth 4 (four) times daily.     pantoprazole (PROTONIX) 40 MG tablet Take 40 mg by mouth daily.     potassium chloride SA (K-DUR,KLOR-CON) 20 MEQ tablet Take 20 mEq by mouth daily.  11   torsemide (DEMADEX) 20 MG tablet Take 40 mg by mouth 2 (two) times daily.     No current facility-administered medications for this encounter.    No Known Allergies  Social History   Socioeconomic History   Marital status: Widowed    Spouse name: Not on file   Number of children: Not on file   Years of education: Not on file   Highest education level: Not on file  Occupational History   Not on file  Tobacco Use   Smoking status: Every Day    Packs/day: 1.00    Types: Cigarettes   Smokeless tobacco: Never   Tobacco comments:    Former smoker 09/30/21  Vaping Use   Vaping Use: Never used  Substance and Sexual Activity   Alcohol use: No   Drug use: No   Sexual activity: Not on file  Other Topics Concern   Not on file  Social History Narrative   Not on file   Social Determinants of Health   Financial Resource Strain: Not on file  Food Insecurity: Not on file  Transportation Needs: Not on file  Physical Activity: Not on file  Stress: Not on file  Social Connections: Not on file  Intimate Partner Violence: Not on file     ROS- All systems are reviewed and negative except as per the HPI above.  Physical Exam: Vitals:   12/21/21 1522  BP: (!) 108/56  Pulse: (!) 117  Weight: 89.6 kg  Height: 6\' 2"  (1.88 m)    GEN- The  patient is a well appearing male, alert and oriented x 3 today.   HEENT-head normocephalic, atraumatic, sclera clear, conjunctiva pink, hearing intact, trachea midline. Lungs- Clear to ausculation bilaterally, normal work of breathing Heart- irregular rate and rhythm, no murmurs, rubs or gallops  GI- soft, NT, ND, + BS Extremities- no clubbing, cyanosis, or edema MS- no significant deformity or atrophy Skin- no rash or lesion Psych- euthymic mood, full affect Neuro- strength and sensation are intact   Wt Readings from Last 3 Encounters:  12/21/21 89.6 kg  12/06/21 89.3 kg  12/01/21 87.3 kg    EKG today demonstrates  Afib with elevated heart rate Vent. rate 117 BPM PR  interval * ms QRS duration 90 ms QT/QTcB 366/510 ms   Epic records are reviewed at length today  CHA2DS2-VASc Score = 3  The patient's score is based upon: CHF History: 0 HTN History: 1 Diabetes History: 0 Stroke History: 0 Vascular Disease History: 1 Age Score: 1 Gender Score: 0       ASSESSMENT AND PLAN: 1. Persistent Atrial Fibrillation (ICD10:  I48.19) The patient's CHA2DS2-VASc score is 3, indicating a 3.2% annual risk of stroke.   Failed dofetilide due to prolonged QT. Started on amiodarone. Will plan for DCCV after he loads on amiodarone.  Continue amiodarone 200 mg BID for 2 weeks then decrease to once daily at the time of DCCV.  Check baseline CXR Continue Eliquis 5 mg BID  2. Secondary Hypercoagulable State (ICD10:  D68.69) The patient is at significant risk for stroke/thromboembolism based upon his CHA2DS2-VASc Score of 3.  Continue Apixaban (Eliquis).   3. CAD S/p CABG and stenting. No anginal symptoms.   Follow up in the AF clinic post DCCV.    Jorja Loa PA-C Afib Clinic [facility] [address] [place], [place] [address] [phone]

## 2021-12-21 NOTE — Patient Instructions (Signed)
On September 18th - decrease amiodarone to 200mg  once a day   Cardioversion scheduled for Monday, September 18th  - Arrive at the September 20 and go to admitting at 8am  - Do not eat or drink anything after midnight the night prior to your procedure.  - Take all your morning medication (except diabetic medications) with a sip of water prior to arrival.  - You will not be able to drive home after your procedure.  - Do NOT miss any doses of your blood thinner - if you should miss a dose please notify our office immediately.  - If you feel as if you go back into normal rhythm prior to scheduled cardioversion, please notify our office immediately. If your procedure is canceled in the cardioversion suite you will be charged a cancellation fee.

## 2021-12-21 NOTE — Progress Notes (Signed)
Primary Care Physician: [name], NP Primary Cardiologist: Dr Eden Emms Primary Electrophysiologist: Dr Ladona Ridgel Referring Physician: Dr Iven Finn Victor Nelson is a 76 y.o. male with a history of CAD s/p CABG, Addison's disease COPD, tobacco abuse, HTN, HLD, chronic back pain, atrial fibrillation who presents for follow up in the [facility].  The patient was initially diagnosed with atrial fibrillation 09/01/21. He was sent to Dr Eden Emms after patient was found to have an elevated heart rate at a different provider office. He did not have any awareness of his afib at that time. ECG showed arib with HR 154 bpm. He was started on metoprolol for rate control and Eliquis for a CHADS2VASC score of 3. He underwent DCCV on 10/12/21 but had early return of afib. He was seen by Dr Ladona Ridgel 11/19/21 who recommended dofetilide. Unfortunately, he failed dofetilide due to prolonged QT and was started on amiodarone. On follow up ECG his heart rates remained elevated and his BB was resumed. He developed dizziness and he discontinued the metoprolol with resolution of his symptoms.   On follow up today, patient remains in afib with suboptimal rate control. Despite being in afib, he feels well today. He denies any missed doses of anticoagulation. He is tolerating the medication without difficulty.   Today, he denies symptoms of palpitations, chest pain, shortness of breath, orthopnea, PND, lower extremity edema, presyncope, syncope, snoring, daytime somnolence, bleeding, or neurologic sequela. The patient is tolerating medications without difficulties and is otherwise without complaint today.    Atrial Fibrillation Risk Factors:  he does not have symptoms or diagnosis of sleep apnea. he does not have a history of rheumatic fever.   he has a BMI of Body mass index is 25.37 kg/m.Marland Kitchen Filed Weights   12/21/21 1522  Weight: 89.6 kg    Family History  Problem Relation Age of Onset    Liver disease Mother    Stroke Father    Addison's disease Brother    Diabetes Maternal Grandmother    Heart attack Maternal Grandfather    Heart attack Paternal Grandfather    Heart attack Brother      Atrial Fibrillation Management history:  Previous antiarrhythmic drugs: dofetilide, amiodarone  Previous cardioversions: 10/12/21 Previous ablations: none CHADS2VASC score: 3 Anticoagulation history: Eliquis   Past Medical History:  Diagnosis Date   Addison's disease (HCC)    Chronic back pain    Coronary artery disease    Dyspnea    GERD (gastroesophageal reflux disease)    Hypercholesterolemia    Hypertension    Myocardial infarct (HCC) 2001   Past Surgical History:  Procedure Laterality Date   APPENDECTOMY     BACK SURGERY     CARDIOVERSION N/A 10/12/2021   Procedure: CARDIOVERSION;  Surgeon: Chrystie Nose, MD;  Location: [facility];  Service: Cardiovascular;  Laterality: N/A;   CHOLECYSTECTOMY     CORONARY ANGIOPLASTY WITH STENT PLACEMENT  04/19/1999   CORONARY ARTERY BYPASS GRAFT  04/19/2003    Current Outpatient Medications  Medication Sig Dispense Refill   albuterol (PROVENTIL HFA;VENTOLIN HFA) 108 (90 BASE) MCG/ACT inhaler Inhale 2 puffs into the lungs every 6 (six) hours as needed for wheezing or shortness of breath. 1 Inhaler 2   amiodarone (PACERONE) 200 MG tablet Take 2 tablets (400 mg total) by mouth 2 (two) times daily for 3 days, THEN 1 tablet (200 mg total) 2 (two) times daily for 30 days, THEN 1 tablet (200 mg total) daily. 70  tablet 0   apixaban (ELIQUIS) 5 MG TABS tablet Take 1 tablet (5 mg total) by mouth 2 (two) times daily. 60 tablet 11   fexofenadine (ALLEGRA) 180 MG tablet Take 180 mg by mouth daily.     fludrocortisone (FLORINEF) 0.1 MG tablet Take 2 tablets (0.2 mg total) by mouth daily. 180 tablet 1   fluticasone (FLONASE) 50 MCG/ACT nasal spray Place 2 sprays into both nostrils daily as needed for allergies.     gabapentin (NEURONTIN) 300  MG capsule Take 600 mg by mouth at bedtime.     hydrocortisone (CORTEF) 10 MG tablet Take 10 mg by mouth 2 (two) times daily.     naloxone (NARCAN) nasal spray 4 mg/0.1 mL Place 1 spray into the nose daily as needed (for overdose).     nitroGLYCERIN (NITROSTAT) 0.4 MG SL tablet Place 1 tablet (0.4 mg total) under the tongue every 5 (five) minutes as needed for chest pain. 25 tablet 3   oxyCODONE-acetaminophen (PERCOCET) 10-325 MG per tablet Take 1 tablet by mouth 4 (four) times daily.     pantoprazole (PROTONIX) 40 MG tablet Take 40 mg by mouth daily.     potassium chloride SA (K-DUR,KLOR-CON) 20 MEQ tablet Take 20 mEq by mouth daily.  11   torsemide (DEMADEX) 20 MG tablet Take 40 mg by mouth 2 (two) times daily.     No current facility-administered medications for this encounter.    No Known Allergies  Social History   Socioeconomic History   Marital status: Widowed    Spouse name: Not on file   Number of children: Not on file   Years of education: Not on file   Highest education level: Not on file  Occupational History   Not on file  Tobacco Use   Smoking status: Every Day    Packs/day: 1.00    Types: Cigarettes   Smokeless tobacco: Never   Tobacco comments:    Former smoker 09/30/21  Vaping Use   Vaping Use: Never used  Substance and Sexual Activity   Alcohol use: No   Drug use: No   Sexual activity: Not on file  Other Topics Concern   Not on file  Social History Narrative   Not on file   Social Determinants of Health   Financial Resource Strain: Not on file  Food Insecurity: Not on file  Transportation Needs: Not on file  Physical Activity: Not on file  Stress: Not on file  Social Connections: Not on file  Intimate Partner Violence: Not on file     ROS- All systems are reviewed and negative except as per the HPI above.  Physical Exam: Vitals:   12/21/21 1522  BP: (!) 108/56  Pulse: (!) 117  Weight: 89.6 kg  Height: 6\' 2"  (1.88 m)    GEN- The  patient is a well appearing male, alert and oriented x 3 today.   HEENT-head normocephalic, atraumatic, sclera clear, conjunctiva pink, hearing intact, trachea midline. Lungs- Clear to ausculation bilaterally, normal work of breathing Heart- irregular rate and rhythm, no murmurs, rubs or gallops  GI- soft, NT, ND, + BS Extremities- no clubbing, cyanosis, or edema MS- no significant deformity or atrophy Skin- no rash or lesion Psych- euthymic mood, full affect Neuro- strength and sensation are intact   Wt Readings from Last 3 Encounters:  12/21/21 89.6 kg  12/06/21 89.3 kg  12/01/21 87.3 kg    EKG today demonstrates  Afib with elevated heart rate Vent. rate 117 BPM PR  interval * ms QRS duration 90 ms QT/QTcB 366/510 ms   Epic records are reviewed at length today  CHA2DS2-VASc Score = 3  The patient's score is based upon: CHF History: 0 HTN History: 1 Diabetes History: 0 Stroke History: 0 Vascular Disease History: 1 Age Score: 1 Gender Score: 0       ASSESSMENT AND PLAN: 1. Persistent Atrial Fibrillation (ICD10:  I48.19) The patient's CHA2DS2-VASc score is 3, indicating a 3.2% annual risk of stroke.   Failed dofetilide due to prolonged QT. Started on amiodarone. Will plan for DCCV after he loads on amiodarone.  Continue amiodarone 200 mg BID for 2 weeks then decrease to once daily at the time of DCCV.  Check baseline CXR Continue Eliquis 5 mg BID  2. Secondary Hypercoagulable State (ICD10:  D68.69) The patient is at significant risk for stroke/thromboembolism based upon his CHA2DS2-VASc Score of 3.  Continue Apixaban (Eliquis).   3. CAD S/p CABG and stenting. No anginal symptoms.   Follow up in the AF clinic post DCCV.    Ricky Copper Basnett PA-C [facility] [address] [place], [place] [address] [phone]  

## 2021-12-27 ENCOUNTER — Encounter (HOSPITAL_COMMUNITY): Payer: Self-pay | Admitting: Cardiovascular Disease

## 2022-01-03 ENCOUNTER — Other Ambulatory Visit: Payer: Self-pay

## 2022-01-03 ENCOUNTER — Encounter (HOSPITAL_COMMUNITY): Payer: Self-pay | Admitting: Cardiovascular Disease

## 2022-01-03 ENCOUNTER — Ambulatory Visit (HOSPITAL_BASED_OUTPATIENT_CLINIC_OR_DEPARTMENT_OTHER): Payer: Medicare HMO | Admitting: Anesthesiology

## 2022-01-03 ENCOUNTER — Ambulatory Visit (HOSPITAL_COMMUNITY)
Admission: RE | Admit: 2022-01-03 | Discharge: 2022-01-03 | Disposition: A | Discharge status: Home / Self Care | Payer: Medicare HMO | Source: Ambulatory Visit | Attending: Cardiovascular Disease | Admitting: Cardiovascular Disease

## 2022-01-03 ENCOUNTER — Encounter (HOSPITAL_COMMUNITY)
Admission: RE | Disposition: A | Discharge status: Home / Self Care | Payer: Self-pay | Source: Ambulatory Visit | Attending: Cardiovascular Disease

## 2022-01-03 ENCOUNTER — Ambulatory Visit (HOSPITAL_COMMUNITY): Payer: Medicare HMO | Admitting: Anesthesiology

## 2022-01-03 DIAGNOSIS — K219 Gastro-esophageal reflux disease without esophagitis: Secondary | ICD-10-CM | POA: Insufficient documentation

## 2022-01-03 DIAGNOSIS — I252 Old myocardial infarction: Secondary | ICD-10-CM | POA: Insufficient documentation

## 2022-01-03 DIAGNOSIS — G8929 Other chronic pain: Secondary | ICD-10-CM | POA: Insufficient documentation

## 2022-01-03 DIAGNOSIS — I4891 Unspecified atrial fibrillation: Secondary | ICD-10-CM

## 2022-01-03 DIAGNOSIS — Z951 Presence of aortocoronary bypass graft: Secondary | ICD-10-CM | POA: Insufficient documentation

## 2022-01-03 DIAGNOSIS — I4819 Other persistent atrial fibrillation: Secondary | ICD-10-CM | POA: Insufficient documentation

## 2022-01-03 DIAGNOSIS — M549 Dorsalgia, unspecified: Secondary | ICD-10-CM | POA: Diagnosis not present

## 2022-01-03 DIAGNOSIS — Z79899 Other long term (current) drug therapy: Secondary | ICD-10-CM | POA: Insufficient documentation

## 2022-01-03 DIAGNOSIS — F17210 Nicotine dependence, cigarettes, uncomplicated: Secondary | ICD-10-CM

## 2022-01-03 DIAGNOSIS — I10 Essential (primary) hypertension: Secondary | ICD-10-CM

## 2022-01-03 DIAGNOSIS — Z7901 Long term (current) use of anticoagulants: Secondary | ICD-10-CM | POA: Diagnosis not present

## 2022-01-03 DIAGNOSIS — D6869 Other thrombophilia: Secondary | ICD-10-CM | POA: Diagnosis not present

## 2022-01-03 DIAGNOSIS — I2510 Atherosclerotic heart disease of native coronary artery without angina pectoris: Secondary | ICD-10-CM | POA: Diagnosis not present

## 2022-01-03 DIAGNOSIS — E271 Primary adrenocortical insufficiency: Secondary | ICD-10-CM | POA: Diagnosis not present

## 2022-01-03 DIAGNOSIS — J449 Chronic obstructive pulmonary disease, unspecified: Secondary | ICD-10-CM | POA: Insufficient documentation

## 2022-01-03 HISTORY — PX: CARDIOVERSION: SHX1299

## 2022-01-03 LAB — POCT I-STAT, CHEM 8
BUN: 15 mg/dL (ref 8–23)
Calcium, Ion: 1.11 mmol/L — ABNORMAL LOW (ref 1.15–1.40)
Chloride: 99 mmol/L (ref 98–111)
Creatinine, Ser: 1.4 mg/dL — ABNORMAL HIGH (ref 0.61–1.24)
Glucose, Bld: 98 mg/dL (ref 70–99)
HCT: 41 % (ref 39.0–52.0)
Hemoglobin: 13.9 g/dL (ref 13.0–17.0)
Potassium: 3.9 mmol/L (ref 3.5–5.1)
Sodium: 137 mmol/L (ref 135–145)
TCO2: 28 mmol/L (ref 22–32)

## 2022-01-03 SURGERY — CARDIOVERSION
Anesthesia: General

## 2022-01-03 MED ORDER — LIDOCAINE 2% (20 MG/ML) 5 ML SYRINGE
INTRAMUSCULAR | Status: DC | PRN
  Administered 2022-01-03: 60 mg via INTRAVENOUS

## 2022-01-03 MED ORDER — PROPOFOL 10 MG/ML IV BOLUS
INTRAVENOUS | Status: DC | PRN
  Administered 2022-01-03: 70 mg via INTRAVENOUS

## 2022-01-03 MED ORDER — SODIUM CHLORIDE 0.9 % IV SOLN
INTRAVENOUS | Status: AC | PRN
  Administered 2022-01-03: 500 mL via INTRAVENOUS

## 2022-01-03 MED ORDER — SODIUM CHLORIDE 0.9 % IV SOLN: INTRAVENOUS | Status: DC

## 2022-01-03 NOTE — CV Procedure (Signed)
   DIRECT CURRENT CARDIOVERSION  NAME:  Victor Nelson    MRN: [id number] DOB:  Sep 10, 1945    ADMIT DATE: 01/03/2022  Indication:  Symptomatic atrial fibrillation   Procedure Note:  The patient signed informed consent.  They have had had therapeutic anticoagulation with eliquis greater than 3 weeks.  Anesthesia was administered by Dr. Rex Kras.  Adequate airway was maintained throughout and vital followed per protocol.  They were cardioverted x 1 with 200J of biphasic synchronized energy.  They converted to NSR.  There were no apparent complications.  The patient had normal neuro status and respiratory status post procedure with vitals stable as recorded elsewhere.    Follow up: They will continue on current medical therapy and follow up with cardiology as scheduled.  Victor Bells T. Audie Box, MD, [place]  [address] [place], [place] [address] [phone]  8:26 AM

## 2022-01-03 NOTE — Anesthesia Preprocedure Evaluation (Signed)
Anesthesia Evaluation  Patient identified by MRN, date of birth, ID band Patient awake    Reviewed: Allergy & Precautions, NPO status , Patient's Chart, lab work & pertinent test results  Airway Mallampati: II  TM Distance: >3 FB Neck ROM: Full    Dental no notable dental hx.    Pulmonary neg pulmonary ROS, Current Smoker,    Pulmonary exam normal        Cardiovascular hypertension, + CAD and + Past MI  + dysrhythmias Atrial Fibrillation  Rhythm:Regular Rate:Normal     Neuro/Psych negative neurological ROS  negative psych ROS   GI/Hepatic Neg liver ROS, GERD  Medicated,  Endo/Other  negative endocrine ROS  Renal/GU negative Renal ROS  negative genitourinary   Musculoskeletal negative musculoskeletal ROS (+)   Abdominal Normal abdominal exam  (+)   Peds  Hematology negative hematology ROS (+)   Anesthesia Other Findings   Reproductive/Obstetrics                             Anesthesia Physical Anesthesia Plan  ASA: 3  Anesthesia Plan: General   Post-op Pain Management:    Induction: Intravenous  PONV Risk Score and Plan: 1  Airway Management Planned: Mask  Additional Equipment: None  Intra-op Plan:   Post-operative Plan:   Informed Consent: I have reviewed the patients History and Physical, chart, labs and discussed the procedure including the risks, benefits and alternatives for the proposed anesthesia with the patient or authorized representative who has indicated his/her understanding and acceptance.     Dental advisory given  Plan Discussed with:   Anesthesia Plan Comments:         Anesthesia Quick Evaluation

## 2022-01-03 NOTE — Anesthesia Procedure Notes (Signed)
Procedure Name: General with mask airway Date/Time: 01/03/2022 8:22 AM  Performed by: Dorann Lodge, CRNAPre-anesthesia Checklist: Patient identified, Emergency Drugs available, Suction available and Patient being monitored Patient Re-evaluated:Patient Re-evaluated prior to induction Oxygen Delivery Method: Ambu bag Preoxygenation: Pre-oxygenation with 100% oxygen Induction Type: IV induction Dental Injury: Teeth and Oropharynx as per pre-operative assessment

## 2022-01-03 NOTE — Transfer of Care (Signed)
Immediate Anesthesia Transfer of Care Note  Patient: Victor Nelson  Procedure(s) Performed: CARDIOVERSION  Patient Location: Endoscopy Unit  Anesthesia Type:General  Level of Consciousness: drowsy  Airway & Oxygen Therapy: Patient Spontanous Breathing  Post-op Assessment: Report given to RN and Post -op Vital signs reviewed and stable  Post vital signs: Reviewed and stable  Last Vitals:  Vitals Value Taken Time  BP 105/68   Temp    Pulse 61   Resp 16   SpO2 100     Last Pain:  Vitals:   01/03/22 0758  TempSrc: Temporal  PainSc: 0-No pain         Complications: No notable events documented.

## 2022-01-03 NOTE — Interval H&P Note (Signed)
History and Physical Interval Note:  01/03/2022 8:02 AM  Victor Nelson  has presented today for surgery, with the diagnosis of AFIB.  The various methods of treatment have been discussed with the patient and family. After consideration of risks, benefits and other options for treatment, the patient has consented to  Procedure(s): CARDIOVERSION (N/A) as a surgical intervention.  The patient's history has been reviewed, patient examined, no change in status, stable for surgery.  I have reviewed the patient's chart and labs.  Questions were answered to the patient's satisfaction.    NPO for DCCV. On eliquis >3 weeks. No missed doses.   [place] Bells T. Audie Box, MD, [place]  [address] [place], [place] [address] [phone]  8:03 AM

## 2022-01-04 NOTE — Anesthesia Postprocedure Evaluation (Signed)
Anesthesia Post Note  Patient: Victor Nelson  Procedure(s) Performed: CARDIOVERSION     Patient location during evaluation: PACU Anesthesia Type: General Level of consciousness: awake and alert Pain management: pain level controlled Vital Signs Assessment: post-procedure vital signs reviewed and stable Respiratory status: spontaneous breathing, nonlabored ventilation, respiratory function stable and patient connected to nasal cannula oxygen Cardiovascular status: blood pressure returned to baseline and stable Postop Assessment: no apparent nausea or vomiting Anesthetic complications: no   No notable events documented.  Last Vitals:  Vitals:   01/03/22 0840 01/03/22 0850  BP: (!) 113/58 115/89  Pulse: 60 (!) 59  Resp: 18 18  Temp:    SpO2: 95% 98%    Last Pain:  Vitals:   01/03/22 0850  TempSrc:   PainSc: 0-No pain                 [name]

## 2022-01-05 ENCOUNTER — Telehealth (HOSPITAL_COMMUNITY): Payer: Self-pay

## 2022-01-05 MED ORDER — AMIODARONE HCL 200 MG PO TABS: ORAL_TABLET | ORAL | 6 refills | Status: AC

## 2022-01-05 NOTE — Telephone Encounter (Signed)
Patient called in regarding his Amiodarone 200mg  medication. He has been taking 1 tablet twice daily. Consulted with Tilda Franco Patient advised to decrease his dose of Amiodarone to 1 tablet by mouth daily. He is doing well and seems to be staying in sinus rhythm. He is scheduled for his follow up next week on 9/26. Reminded patient about his follow up appointment. Erxed medication to his pharmacy. Communicated with patient and he verbalized understanding.

## 2022-01-11 ENCOUNTER — Encounter (HOSPITAL_COMMUNITY): Payer: Self-pay | Admitting: Physician Assistant

## 2022-01-11 ENCOUNTER — Ambulatory Visit (HOSPITAL_COMMUNITY)
Admission: RE | Admit: 2022-01-11 | Discharge: 2022-01-11 | Disposition: A | Discharge status: Home / Self Care | Payer: Medicare HMO | Source: Ambulatory Visit | Attending: Physician Assistant | Admitting: Physician Assistant

## 2022-01-11 VITALS — BP 138/70 | HR 66 | Ht 74.0 in | Wt 192.6 lb

## 2022-01-11 DIAGNOSIS — Z7901 Long term (current) use of anticoagulants: Secondary | ICD-10-CM | POA: Diagnosis not present

## 2022-01-11 DIAGNOSIS — I2510 Atherosclerotic heart disease of native coronary artery without angina pectoris: Secondary | ICD-10-CM | POA: Insufficient documentation

## 2022-01-11 DIAGNOSIS — J449 Chronic obstructive pulmonary disease, unspecified: Secondary | ICD-10-CM | POA: Diagnosis not present

## 2022-01-11 DIAGNOSIS — D6869 Other thrombophilia: Secondary | ICD-10-CM | POA: Diagnosis not present

## 2022-01-11 DIAGNOSIS — E271 Primary adrenocortical insufficiency: Secondary | ICD-10-CM | POA: Diagnosis not present

## 2022-01-11 DIAGNOSIS — M549 Dorsalgia, unspecified: Secondary | ICD-10-CM | POA: Insufficient documentation

## 2022-01-11 DIAGNOSIS — Z720 Tobacco use: Secondary | ICD-10-CM | POA: Insufficient documentation

## 2022-01-11 DIAGNOSIS — Z951 Presence of aortocoronary bypass graft: Secondary | ICD-10-CM | POA: Diagnosis not present

## 2022-01-11 DIAGNOSIS — I4819 Other persistent atrial fibrillation: Secondary | ICD-10-CM | POA: Insufficient documentation

## 2022-01-11 DIAGNOSIS — G8929 Other chronic pain: Secondary | ICD-10-CM | POA: Insufficient documentation

## 2022-01-11 DIAGNOSIS — E785 Hyperlipidemia, unspecified: Secondary | ICD-10-CM | POA: Diagnosis not present

## 2022-01-11 DIAGNOSIS — I10 Essential (primary) hypertension: Secondary | ICD-10-CM | POA: Diagnosis not present

## 2022-01-11 LAB — BASIC METABOLIC PANEL
Anion gap: 8 (ref 5–15)
BUN: 10 mg/dL (ref 8–23)
CO2: 26 mmol/L (ref 22–32)
Calcium: 9.8 mg/dL (ref 8.9–10.3)
Chloride: 101 mmol/L (ref 98–111)
Creatinine, Ser: 1.53 mg/dL — ABNORMAL HIGH (ref 0.61–1.24)
GFR, Estimated: 48 mL/min — ABNORMAL LOW (ref >60)
Glucose, Bld: 109 mg/dL — ABNORMAL HIGH (ref 70–99)
Potassium: 4.6 mmol/L (ref 3.5–5.1)
Sodium: 135 mmol/L (ref 135–145)

## 2022-01-11 NOTE — Progress Notes (Signed)
Primary Care Physician: Emelia Loron, NP Primary Cardiologist: Dr Johnsie Cancel Primary Electrophysiologist: Dr Lovena Le Referring Physician: Dr Coletta Memos Victor Nelson is a 76 y.o. male with a history of CAD s/p CABG, Addison's disease COPD, tobacco abuse, HTN, HLD, chronic back pain, atrial fibrillation who presents for follow up in the [facility].  The patient was initially diagnosed with atrial fibrillation 09/01/21. He was sent to Dr Johnsie Cancel after patient was found to have an elevated heart rate at a different provider office. He did not have any awareness of his afib at that time. ECG showed arib with HR 154 bpm. He was started on metoprolol for rate control and Eliquis for a CHADS2VASC score of 3. He underwent DCCV on 10/12/21 but had early return of afib. He was seen by Dr Lovena Le 11/19/21 who recommended dofetilide. Unfortunately, he failed dofetilide due to prolonged QT and was started on amiodarone. On follow up ECG his heart rates remained elevated and his BB was resumed. He developed dizziness and he discontinued the metoprolol with resolution of his symptoms.   On follow up today, patient is s/p DCCV on 01/03/22. He is in SR today and reports that he has more energy. No bleeding issues on anticoagulation.   Today, he denies symptoms of palpitations, chest pain, shortness of breath, orthopnea, PND, lower extremity edema, presyncope, syncope, snoring, daytime somnolence, bleeding, or neurologic sequela. The patient is tolerating medications without difficulties and is otherwise without complaint today.    Atrial Fibrillation Risk Factors:  he does not have symptoms or diagnosis of sleep apnea. he does not have a history of rheumatic fever.   he has a BMI of Body mass index is 24.73 kg/m.Marland Kitchen Filed Weights   01/11/22 1444  Weight: 87.4 kg    Family History  Problem Relation Age of Onset   Liver disease Mother    Stroke Father    Addison's disease Brother     Diabetes Maternal Grandmother    Heart attack Maternal Grandfather    Heart attack Paternal Grandfather    Heart attack Brother      Atrial Fibrillation Management history:  Previous antiarrhythmic drugs: dofetilide, amiodarone  Previous cardioversions: 10/12/21, 01/03/22 Previous ablations: none CHADS2VASC score: 3 Anticoagulation history: Eliquis   Past Medical History:  Diagnosis Date   Addison's disease ([place])    Chronic back pain    Coronary artery disease    Dyspnea    GERD (gastroesophageal reflux disease)    Hypercholesterolemia    Hypertension    Myocardial infarct ([place]) 2001   Past Surgical History:  Procedure Laterality Date   APPENDECTOMY     BACK SURGERY     CARDIOVERSION N/A 10/12/2021   Procedure: CARDIOVERSION;  Surgeon: Pixie Casino, MD;  Location: [place];  Service: Cardiovascular;  Laterality: N/A;   CARDIOVERSION N/A 01/03/2022   Procedure: CARDIOVERSION;  Surgeon: Geralynn Rile, MD;  Location: [place];  Service: Cardiovascular;  Laterality: N/A;   CHOLECYSTECTOMY     CORONARY ANGIOPLASTY WITH STENT PLACEMENT  04/19/1999   CORONARY ARTERY BYPASS GRAFT  04/19/2003    Current Outpatient Medications  Medication Sig Dispense Refill   albuterol (PROVENTIL HFA;VENTOLIN HFA) 108 (90 BASE) MCG/ACT inhaler Inhale 2 puffs into the lungs every 6 (six) hours as needed for wheezing or shortness of breath. 1 Inhaler 2   amiodarone (PACERONE) 200 MG tablet Take one tablet by mouth daily 30 tablet 6   apixaban (ELIQUIS) 5 MG TABS tablet  Take 1 tablet (5 mg total) by mouth 2 (two) times daily. 60 tablet 11   fexofenadine (ALLEGRA) 180 MG tablet Take 180 mg by mouth daily.     fludrocortisone (FLORINEF) 0.1 MG tablet Take 2 tablets (0.2 mg total) by mouth daily. 180 tablet 1   fluticasone (FLONASE) 50 MCG/ACT nasal spray Place 2 sprays into both nostrils daily as needed for allergies.     gabapentin (NEURONTIN) 300 MG capsule Take 600 mg by mouth at  bedtime.     hydrocortisone (CORTEF) 10 MG tablet Take 10 mg by mouth 2 (two) times daily.     naloxone (NARCAN) nasal spray 4 mg/0.1 mL Place 1 spray into the nose daily as needed (for overdose).     nitroGLYCERIN (NITROSTAT) 0.4 MG SL tablet Place 1 tablet (0.4 mg total) under the tongue every 5 (five) minutes as needed for chest pain. 25 tablet 3   oxyCODONE-acetaminophen (PERCOCET) 10-325 MG per tablet Take 1 tablet by mouth 4 (four) times daily.     pantoprazole (PROTONIX) 40 MG tablet Take 40 mg by mouth daily.     potassium chloride SA (K-DUR,KLOR-CON) 20 MEQ tablet Take 20 mEq by mouth daily.  11   torsemide (DEMADEX) 20 MG tablet Take 40 mg by mouth 2 (two) times daily.     No current facility-administered medications for this encounter.    No Known Allergies  Social History   Socioeconomic History   Marital status: Widowed    Spouse name: Not on file   Number of children: Not on file   Years of education: Not on file   Highest education level: Not on file  Occupational History   Not on file  Tobacco Use   Smoking status: Every Day    Packs/day: 1.00    Types: Cigarettes   Smokeless tobacco: Never   Tobacco comments:    Former smoker 09/30/21  Vaping Use   Vaping Use: Never used  Substance and Sexual Activity   Alcohol use: No   Drug use: No   Sexual activity: Not on file  Other Topics Concern   Not on file  Social History Narrative   Not on file   Social Determinants of Health   Financial Resource Strain: Not on file  Food Insecurity: Not on file  Transportation Needs: Not on file  Physical Activity: Not on file  Stress: Not on file  Social Connections: Not on file  Intimate Partner Violence: Not on file     ROS- All systems are reviewed and negative except as per the HPI above.  Physical Exam: Vitals:   01/11/22 1444  BP: 138/70  Pulse: 66  Weight: 87.4 kg  Height: 6\' 2"  (1.88 m)     GEN- The patient is a well appearing male, alert and  oriented x 3 today.   HEENT-head normocephalic, atraumatic, sclera clear, conjunctiva pink, hearing intact, trachea midline. Lungs- Clear to ausculation bilaterally, normal work of breathing Heart- Regular rate and rhythm, no murmurs, rubs or gallops  GI- soft, NT, ND, + BS Extremities- no clubbing, cyanosis, or edema MS- no significant deformity or atrophy Skin- no rash or lesion Psych- euthymic mood, full affect Neuro- strength and sensation are intact   Wt Readings from Last 3 Encounters:  01/11/22 87.4 kg  12/21/21 89.6 kg  12/06/21 89.3 kg    EKG today demonstrates  SR, PAC Vent. rate 66 BPM PR interval 172 ms QRS duration 90 ms QT/QTcB 444/465 ms   Epic records  are reviewed at length today  CHA2DS2-VASc Score = 3  The patient's score is based upon: CHF History: 0 HTN History: 1 Diabetes History: 0 Stroke History: 0 Vascular Disease History: 1 Age Score: 1 Gender Score: 0       ASSESSMENT AND PLAN: 1. Persistent Atrial Fibrillation (ICD10:  I48.19) The patient's CHA2DS2-VASc score is 3, indicating a 3.2% annual risk of stroke.   Failed dofetilide due to prolonged QT. Started on amiodarone. S/p DCCV on 01/03/22 Patient appears to be maintaining SR.  Continue amiodarone 200 mg daily Will plan to check TSH/Cmet on follow up.  Continue Eliquis 5 mg BID  2. Secondary Hypercoagulable State (ICD10:  D68.69) The patient is at significant risk for stroke/thromboembolism based upon his CHA2DS2-VASc Score of 3.  Continue Apixaban (Eliquis).   3. CAD S/p CABG and stenting. No anginal symptoms.   Follow up in the AF clinic in 3-4 months.    Jorja Loa PA-C [facility] Clinic [facility] [address] [place], [place] [address] [phone]

## 2022-04-07 NOTE — Progress Notes (Signed)
CARDIOLOGY CONSULT NOTE       Patient ID: Victor Nelson MRN: [id number] DOB/AGE: 1945/12/16 76 y.o.  Admit date: (Not on file) Referring Physician: Cecile Sheerer  Primary Physician: [name], NP Primary Cardiologist: Eden Emms EP:  Arther Abbott Reason for Consultation: CAD/CABG   HPI:  76 y.o. 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 COPD and uses inhalers Sees [facility] Nails from Endocrine Does not appear to be on statin and no recorded allergies Gets more LE edema on higher dose steroids   GI doctor noted rapid HR in May 2023 was being w/u for unintentional weight loss noted to be volume overloaded and in afib Started on eliquis , lopressor and demedex  Had successful [facility] on 10/12/21 with Dr Rennis Golden   Back in afib July 2023  Unaware likely as rate better controlled. Compliant with meds and eliquis  Admitted in August for Tikosyn loading and converted with medication but QT too long even on 125 ug dose so stopped and d/c to discuss amiodarone Rx  Still walking Smoking with some dyspnea Socializes at former employer [employer] couple days / week Started on amiodarone as outpatient beta blocker d/c with some bradycardia Repeat [facility] Dr Scharlene Gloss 01/03/22 with need for only one shock   In NSR today no palpitations Some URI with congestion and cough No fevers    ROS All other systems reviewed and negative except as noted above  Past Medical History:  Diagnosis Date   Addison's disease (HCC)    Chronic back pain    Coronary artery disease    Dyspnea    GERD (gastroesophageal reflux disease)    Hypercholesterolemia    Hypertension    Myocardial infarct  (HCC) 2001    Family History  Problem Relation Age of Onset   Liver disease Mother    Stroke Father    Addison's disease Brother    Diabetes Maternal Grandmother    Heart attack Maternal Grandfather    Heart attack Paternal Grandfather    Heart attack Brother     Social History   Socioeconomic History   Marital status: Widowed    Spouse name: Not on file   Number of children: Not on file   Years of education: Not on file   Highest education level: Not on file  Occupational History   Not on file  Tobacco Use   Smoking status: Every Day    Packs/day: 1.00    Types: Cigarettes   Smokeless tobacco: Never   Tobacco comments:    Former smoker 09/30/21  Vaping Use   Vaping Use: Never used  Substance and Sexual Activity   Alcohol use: No   Drug use: No   Sexual activity: Not on file  Other Topics Concern   Not on file  Social History Narrative   Not on file   Social Determinants of Health   Financial Resource Strain: Not on file  Food Insecurity: Not on file  Transportation Needs: Not on file  Physical Activity: Not on file  Stress: Not on file  Social Connections: Not on file  Intimate Partner Violence: Not on file  Past Surgical History:  Procedure Laterality Date   APPENDECTOMY     BACK SURGERY     CARDIOVERSION N/A 10/12/2021   Procedure: CARDIOVERSION;  Surgeon: Chrystie Nose, MD;  Location: [facility] ENDOSCOPY;  Service: Cardiovascular;  Laterality: N/A;   CARDIOVERSION N/A 01/03/2022   Procedure: CARDIOVERSION;  Surgeon: Sande Rives, MD;  Location: [facility] ENDOSCOPY;  Service: Cardiovascular;  Laterality: N/A;   CHOLECYSTECTOMY     CORONARY ANGIOPLASTY WITH STENT PLACEMENT  04/19/1999   CORONARY ARTERY BYPASS GRAFT  04/19/2003      Current Outpatient Medications:    albuterol (PROVENTIL HFA;VENTOLIN HFA) 108 (90 BASE) MCG/ACT inhaler, Inhale 2 puffs into the lungs every 6 (six) hours as needed for wheezing or shortness of breath., Disp: 1 Inhaler, Rfl:  2   amiodarone (PACERONE) 200 MG tablet, Take one tablet by mouth daily, Disp: 30 tablet, Rfl: 6   apixaban (ELIQUIS) 5 MG TABS tablet, Take 1 tablet (5 mg total) by mouth 2 (two) times daily., Disp: 60 tablet, Rfl: 11   fexofenadine (ALLEGRA) 180 MG tablet, Take 180 mg by mouth daily., Disp: , Rfl:    fludrocortisone (FLORINEF) 0.1 MG tablet, Take 2 tablets (0.2 mg total) by mouth daily., Disp: 180 tablet, Rfl: 1   fluticasone (FLONASE) 50 MCG/ACT nasal spray, Place 2 sprays into both nostrils daily as needed for allergies., Disp: , Rfl:    gabapentin (NEURONTIN) 300 MG capsule, Take 600 mg by mouth at bedtime., Disp: , Rfl:    hydrocortisone (CORTEF) 10 MG tablet, Take 10 mg by mouth 2 (two) times daily., Disp: , Rfl:    naloxone (NARCAN) nasal spray 4 mg/0.1 mL, Place 1 spray into the nose daily as needed (for overdose)., Disp: , Rfl:    nitroGLYCERIN (NITROSTAT) 0.4 MG SL tablet, Place 1 tablet (0.4 mg total) under the tongue every 5 (five) minutes as needed for chest pain., Disp: 25 tablet, Rfl: 3   oxyCODONE-acetaminophen (PERCOCET) 10-325 MG per tablet, Take 1 tablet by mouth 4 (four) times daily., Disp: , Rfl:    pantoprazole (PROTONIX) 40 MG tablet, Take 40 mg by mouth daily., Disp: , Rfl:    potassium chloride SA (K-DUR,KLOR-CON) 20 MEQ tablet, Take 20 mEq by mouth daily., Disp: , Rfl: 11   torsemide (DEMADEX) 20 MG tablet, Take 40 mg by mouth 2 (two) times daily., Disp: , Rfl:     Physical Exam: Blood pressure (!) 148/66, pulse 60, height 6\' 2"  (1.88 m), weight 192 lb 6.4 oz (87.3 kg), SpO2 95 %.    Affect appropriate Healthy:  appears stated age HEENT: normal Neck supple with no adenopathy JVP normal no bruits no thyromegaly Lungs clear with no wheezing and good diaphragmatic motion Heart:  S1/S2 no murmur, no rub, gallop or click PMI normal post sternotomy  Abdomen: benighn, BS positve, no tenderness, no AAA no bruit.  No HSM or HJR post appendectomy and cholecystectomy   Distal pulses intact with no bruits post left radial harvest  Plus one bilateral edema Neuro non-focal Skin warm and dry No muscular weakness   Labs:   Lab Results  Component Value Date   WBC 7.0 12/21/2021   HGB 13.9 01/03/2022   HCT 41.0 01/03/2022   MCV 85.1 12/21/2021   PLT 189 12/21/2021   No results for input(s): "NA", "K", "CL", "CO2", "BUN", "CREATININE", "CALCIUM", "PROT", "BILITOT", "ALKPHOS", "ALT", "AST", "GLUCOSE" in the last 168 hours.  Invalid input(s): "LABALBU" Lab Results  Component Value Date   CKTOTAL 173  05/15/2007   TROPONINI <0.03 07/19/2014    Lab Results  Component Value Date   CHOL 182 03/22/2007   Lab Results  Component Value Date   HDL 33.7 (L) 03/22/2007   No results found for: "LDLCALC" Lab Results  Component Value Date   TRIG 293 [facility]) 03/22/2007   Lab Results  Component Value Date   CHOLHDL 5.4 CALC 03/22/2007   Lab Results  Component Value Date   LDLDIRECT 94.1 03/22/2007      Radiology: No results found.  EKG: Afib rate 154 poor R wave progression LAD 04/15/2022 afib rate 101 nonspecific ST changes    ASSESSMENT AND PLAN:  CAD/CABG:  distant with two arterial grafts Appears that he may have had stents prior to CABG. No chest pain will need risk stratification after afib taken care of  HLD:  needs labs not on statin  Addisons:  f/u endocrine continue cortef and florinef Smoking:  counseled on cessation < 10 minutes lung cancer screeing CT continue proventil Edema:  dependant with varicosities and obesity see below AFib: on Eliquis CHADV VASC 3 post [facility] successful 10/12/21 Continue lopressor ERAF with Tikosyn conversion but d/c due to long QT. Amiodarone loaded and successful DCC on 01/03/22 Not clear to me why Dr Ladona Ridgel felt he was a poor ablation candidate     F/U me in  6 months     Signed: Charlton Haws 04/15/2022, 1:55 PM

## 2022-04-15 ENCOUNTER — Encounter: Payer: Self-pay | Admitting: Cardiovascular Disease

## 2022-04-15 ENCOUNTER — Ambulatory Visit: Payer: Medicare HMO | Attending: Cardiovascular Disease | Admitting: Cardiovascular Disease

## 2022-04-15 VITALS — BP 148/66 | HR 60 | Ht 74.0 in | Wt 192.4 lb

## 2022-04-15 DIAGNOSIS — Z951 Presence of aortocoronary bypass graft: Secondary | ICD-10-CM

## 2022-04-15 DIAGNOSIS — I4819 Other persistent atrial fibrillation: Secondary | ICD-10-CM

## 2022-04-15 NOTE — Patient Instructions (Signed)
Medication Instructions:  Your physician recommends that you continue on your current medications as directed. Please refer to the Current Medication list given to you today.   Labwork: None  Testing/Procedures: None  Follow-Up: Follow up with Dr. Nishan in 6 months.   Any Other Special Instructions Will Be Listed Below (If Applicable).     If you need a refill on your cardiac medications before your next appointment, please call your pharmacy.  

## 2022-05-18 ENCOUNTER — Ambulatory Visit (HOSPITAL_COMMUNITY): Payer: Medicare HMO | Admitting: Physician Assistant

## 2022-05-18 NOTE — Progress Notes (Incomplete)
Primary Care Physician: Emelia Loron, NP Primary Cardiologist: Dr Johnsie Cancel Primary Electrophysiologist: Dr Lovena Le Referring Physician: Dr Coletta Memos Victor Nelson is a 77 y.o. male with a history of CAD s/p CABG, Addison's disease COPD, tobacco abuse, HTN, HLD, chronic back pain, atrial fibrillation who presents for follow up in the [facility].  The patient was initially diagnosed with atrial fibrillation 09/01/21. He was sent to Dr Johnsie Cancel after patient was found to have an elevated heart rate at a different provider office. He did not have any awareness of his afib at that time. ECG showed arib with HR 154 bpm. He was started on metoprolol for rate control and Eliquis for a CHADS2VASC score of 3. He underwent DCCV on 10/12/21 but had early return of afib. He was seen by Dr Lovena Le 11/19/21 who recommended dofetilide. Unfortunately, he failed dofetilide due to prolonged QT and was started on amiodarone. On follow up ECG his heart rates remained elevated and his BB was resumed. He developed dizziness and he discontinued the metoprolol with resolution of his symptoms. Patient is s/p DCCV on 01/03/22.  On follow up today, ***  Today, he denies symptoms of ***palpitations, chest pain, shortness of breath, orthopnea, PND, lower extremity edema, presyncope, syncope, snoring, daytime somnolence, bleeding, or neurologic sequela. The patient is tolerating medications without difficulties and is otherwise without complaint today.    Atrial Fibrillation Risk Factors:  he does not have symptoms or diagnosis of sleep apnea. he does not have a history of rheumatic fever.   he has a BMI of There is no height or weight on file to calculate BMI.. There were no vitals filed for this visit.   Family History  Problem Relation Age of Onset   Liver disease Mother    Stroke Father    Addison's disease Brother    Diabetes Maternal Grandmother    Heart attack Maternal Grandfather     Heart attack Paternal Grandfather    Heart attack Brother      Atrial Fibrillation Management history:  Previous antiarrhythmic drugs: dofetilide, amiodarone  Previous cardioversions: 10/12/21, 01/03/22 Previous ablations: none CHADS2VASC score: 3 Anticoagulation history: Eliquis   Past Medical History:  Diagnosis Date   Addison's disease ([place])    Chronic back pain    Coronary artery disease    Dyspnea    GERD (gastroesophageal reflux disease)    Hypercholesterolemia    Hypertension    Myocardial infarct ([place]) 2001   Past Surgical History:  Procedure Laterality Date   APPENDECTOMY     BACK SURGERY     CARDIOVERSION N/A 10/12/2021   Procedure: CARDIOVERSION;  Surgeon: Pixie Casino, MD;  Location: [place];  Service: Cardiovascular;  Laterality: N/A;   CARDIOVERSION N/A 01/03/2022   Procedure: CARDIOVERSION;  Surgeon: Geralynn Rile, MD;  Location: [place];  Service: Cardiovascular;  Laterality: N/A;   CHOLECYSTECTOMY     CORONARY ANGIOPLASTY WITH STENT PLACEMENT  04/19/1999   CORONARY ARTERY BYPASS GRAFT  04/19/2003    Current Outpatient Medications  Medication Sig Dispense Refill   albuterol (PROVENTIL HFA;VENTOLIN HFA) 108 (90 BASE) MCG/ACT inhaler Inhale 2 puffs into the lungs every 6 (six) hours as needed for wheezing or shortness of breath. 1 Inhaler 2   amiodarone (PACERONE) 200 MG tablet Take one tablet by mouth daily 30 tablet 6   apixaban (ELIQUIS) 5 MG TABS tablet Take 1 tablet (5 mg total) by mouth 2 (two) times daily. 60 tablet 11  fexofenadine (ALLEGRA) 180 MG tablet Take 180 mg by mouth daily.     fludrocortisone (FLORINEF) 0.1 MG tablet Take 2 tablets (0.2 mg total) by mouth daily. 180 tablet 1   fluticasone (FLONASE) 50 MCG/ACT nasal spray Place 2 sprays into both nostrils daily as needed for allergies.     gabapentin (NEURONTIN) 300 MG capsule Take 600 mg by mouth at bedtime.     hydrocortisone (CORTEF) 10 MG tablet Take 10 mg by mouth 2  (two) times daily.     naloxone (NARCAN) nasal spray 4 mg/0.1 mL Place 1 spray into the nose daily as needed (for overdose).     nitroGLYCERIN (NITROSTAT) 0.4 MG SL tablet Place 1 tablet (0.4 mg total) under the tongue every 5 (five) minutes as needed for chest pain. 25 tablet 3   oxyCODONE-acetaminophen (PERCOCET) 10-325 MG per tablet Take 1 tablet by mouth 4 (four) times daily.     pantoprazole (PROTONIX) 40 MG tablet Take 40 mg by mouth daily.     potassium chloride SA (K-DUR,KLOR-CON) 20 MEQ tablet Take 20 mEq by mouth daily.  11   torsemide (DEMADEX) 20 MG tablet Take 40 mg by mouth 2 (two) times daily.     No current facility-administered medications for this visit.    No Known Allergies  Social History   Socioeconomic History   Marital status: Widowed    Spouse name: Not on file   Number of children: Not on file   Years of education: Not on file   Highest education level: Not on file  Occupational History   Not on file  Tobacco Use   Smoking status: Every Day    Packs/day: 1.00    Types: Cigarettes   Smokeless tobacco: Never   Tobacco comments:    Former smoker 09/30/21  Vaping Use   Vaping Use: Never used  Substance and Sexual Activity   Alcohol use: No   Drug use: No   Sexual activity: Not on file  Other Topics Concern   Not on file  Social History Narrative   Not on file   Social Determinants of Health   Financial Resource Strain: Not on file  Food Insecurity: Not on file  Transportation Needs: Not on file  Physical Activity: Not on file  Stress: Not on file  Social Connections: Not on file  Intimate Partner Violence: Not on file     ROS- All systems are reviewed and negative except as per the HPI above.  Physical Exam: There were no vitals filed for this visit.   GEN- The patient is a well appearing *** {Desc; male/male:[id number]}, alert and oriented x 3 today.   HEENT-head normocephalic, atraumatic, sclera clear, conjunctiva pink, hearing intact,  trachea midline. Lungs- Clear to ausculation bilaterally, normal work of breathing Heart- ***Regular rate and rhythm, no murmurs, rubs or gallops  GI- soft, NT, ND, + BS Extremities- no clubbing, cyanosis, or edema MS- no significant deformity or atrophy Skin- no rash or lesion Psych- euthymic mood, full affect Neuro- strength and sensation are intact   Wt Readings from Last 3 Encounters:  04/15/22 87.3 kg  01/11/22 87.4 kg  12/21/21 89.6 kg    EKG today demonstrates  ***   Epic records are reviewed at length today  CHA2DS2-VASc Score = 3  The patient's score is based upon: CHF History: 0 HTN History: 1 Diabetes History: 0 Stroke History: 0 Vascular Disease History: 1 Age Score: 1 Gender Score: 0       ASSESSMENT  AND PLAN: 1. Persistent Atrial Fibrillation (ICD10:  I48.19) The patient's CHA2DS2-VASc score is 3, indicating a 3.2% annual risk of stroke.   Failed dofetilide due to prolonged QT. Started on amiodarone. *** Continue amiodarone 200 mg daily Check TSH/Cmet today. Continue Eliquis 5 mg BID  2. Secondary Hypercoagulable State (ICD10:  D68.69) The patient is at significant risk for stroke/thromboembolism based upon his CHA2DS2-VASc Score of 3.  Continue Apixaban (Eliquis).   3. CAD S/p CABG and stenting. No anginal symptoms. ***   Follow up ***in the AF clinic in 6 months.    [facility] [address] [place], [place] [address] [phone]

## 2022-05-19 DEATH — deceased

## 2022-05-26 ENCOUNTER — Encounter (HOSPITAL_COMMUNITY): Payer: Self-pay | Admitting: *Deleted

## 2022-05-30 ENCOUNTER — Ambulatory Visit: Payer: Medicare HMO | Admitting: "Endocrinology

## 2022-09-11 NOTE — Progress Notes (Deleted)
CARDIOLOGY CONSULT NOTE       Patient ID: Victor Nelson MRN: [id number] DOB/AGE: [birth year] 77 y.o.  Admit date: (Not on file) Referring Physician: Cecile Sheerer  Primary Physician: [name], NP Primary Cardiologist: Eden Emms EP:  Arther Abbott Reason for Consultation: CAD/CABG   HPI:  77 y.o. 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 COPD and uses inhalers Sees [facility] Nails from Endocrine Does not appear to be on statin and no recorded allergies Gets more LE edema on higher dose steroids   GI doctor noted rapid HR in May 2023 was being w/u for unintentional weight loss noted to be volume overloaded and in afib Started on eliquis , lopressor and demedex  Had successful [facility] on 10/12/21 with Dr Rennis Golden   Back in afib July 2023  Unaware likely as rate better controlled. Compliant with meds and eliquis  Admitted in August for Tikosyn loading and converted with medication but QT too long even on 125 ug dose so stopped and d/c to discuss amiodarone Rx  Still walking Smoking with some dyspnea Socializes at former employer [employer] couple days / week Started on amiodarone as outpatient beta blocker d/c with some bradycardia Repeat [facility] Dr Scharlene Gloss 01/03/22 with need for only one shock   In NSR today no palpitations Some URI with congestion and cough No fevers   ***   ROS All other systems reviewed and negative except as noted above  Past Medical History:  Diagnosis Date   Addison's disease (HCC)    Chronic back pain    Coronary artery disease    Dyspnea    GERD (gastroesophageal reflux disease)    Hypercholesterolemia    Hypertension    Myocardial  infarct (HCC) 2001    Family History  Problem Relation Age of Onset   Liver disease Mother    Stroke Father    Addison's disease Brother    Diabetes Maternal Grandmother    Heart attack Maternal Grandfather    Heart attack Paternal Grandfather    Heart attack Brother     Social History   Socioeconomic History   Marital status: Widowed    Spouse name: Not on file   Number of children: Not on file   Years of education: Not on file   Highest education level: Not on file  Occupational History   Not on file  Tobacco Use   Smoking status: Every Day    Packs/day: 1    Types: Cigarettes   Smokeless tobacco: Never   Tobacco comments:    Former smoker 09/30/21  Vaping Use   Vaping Use: Never used  Substance and Sexual Activity   Alcohol use: No   Drug use: No   Sexual activity: Not on file  Other Topics Concern   Not on file  Social History Narrative   Not on file   Social Determinants of Health   Financial Resource Strain: Not on file  Food Insecurity: Not on file  Transportation Needs: Not on file  Physical Activity: Not on file  Stress: Not on file  Social Connections: Not on file  Intimate Partner Violence: Not  on file    Past Surgical History:  Procedure Laterality Date   APPENDECTOMY     BACK SURGERY     CARDIOVERSION N/A 10/12/2021   Procedure: CARDIOVERSION;  Surgeon: Chrystie Nose, MD;  Location: [facility] ENDOSCOPY;  Service: Cardiovascular;  Laterality: N/A;   CARDIOVERSION N/A 01/03/2022   Procedure: CARDIOVERSION;  Surgeon: Sande Rives, MD;  Location: [facility] ENDOSCOPY;  Service: Cardiovascular;  Laterality: N/A;   CHOLECYSTECTOMY     CORONARY ANGIOPLASTY WITH STENT PLACEMENT  04/19/1999   CORONARY ARTERY BYPASS GRAFT  04/19/2003      Current Outpatient Medications:    albuterol (PROVENTIL HFA;VENTOLIN HFA) 108 (90 BASE) MCG/ACT inhaler, Inhale 2 puffs into the lungs every 6 (six) hours as needed for wheezing or shortness of breath., Disp: 1 Inhaler,  Rfl: 2   amiodarone (PACERONE) 200 MG tablet, Take one tablet by mouth daily, Disp: 30 tablet, Rfl: 6   apixaban (ELIQUIS) 5 MG TABS tablet, Take 1 tablet (5 mg total) by mouth 2 (two) times daily., Disp: 60 tablet, Rfl: 11   fexofenadine (ALLEGRA) 180 MG tablet, Take 180 mg by mouth daily., Disp: , Rfl:    fludrocortisone (FLORINEF) 0.1 MG tablet, Take 2 tablets (0.2 mg total) by mouth daily., Disp: 180 tablet, Rfl: 1   fluticasone (FLONASE) 50 MCG/ACT nasal spray, Place 2 sprays into both nostrils daily as needed for allergies., Disp: , Rfl:    gabapentin (NEURONTIN) 300 MG capsule, Take 600 mg by mouth at bedtime., Disp: , Rfl:    hydrocortisone (CORTEF) 10 MG tablet, Take 10 mg by mouth 2 (two) times daily., Disp: , Rfl:    naloxone (NARCAN) nasal spray 4 mg/0.1 mL, Place 1 spray into the nose daily as needed (for overdose)., Disp: , Rfl:    nitroGLYCERIN (NITROSTAT) 0.4 MG SL tablet, Place 1 tablet (0.4 mg total) under the tongue every 5 (five) minutes as needed for chest pain., Disp: 25 tablet, Rfl: 3   oxyCODONE-acetaminophen (PERCOCET) 10-325 MG per tablet, Take 1 tablet by mouth 4 (four) times daily., Disp: , Rfl:    pantoprazole (PROTONIX) 40 MG tablet, Take 40 mg by mouth daily., Disp: , Rfl:    potassium chloride SA (K-DUR,KLOR-CON) 20 MEQ tablet, Take 20 mEq by mouth daily., Disp: , Rfl: 11   torsemide (DEMADEX) 20 MG tablet, Take 40 mg by mouth 2 (two) times daily., Disp: , Rfl:     Physical Exam: There were no vitals taken for this visit.    Affect appropriate Healthy:  appears stated age HEENT: normal Neck supple with no adenopathy JVP normal no bruits no thyromegaly Lungs clear with no wheezing and good diaphragmatic motion Heart:  S1/S2 no murmur, no rub, gallop or click PMI normal post sternotomy  Abdomen: benighn, BS positve, no tenderness, no AAA no bruit.  No HSM or HJR post appendectomy and cholecystectomy  Distal pulses intact with no bruits post left radial  harvest  Plus one bilateral edema Neuro non-focal Skin warm and dry No muscular weakness   Labs:   Lab Results  Component Value Date   WBC 7.0 12/21/2021   HGB 13.9 01/03/2022   HCT 41.0 01/03/2022   MCV 85.1 12/21/2021   PLT 189 12/21/2021   No results for input(s): "NA", "K", "CL", "CO2", "BUN", "CREATININE", "CALCIUM", "PROT", "BILITOT", "ALKPHOS", "ALT", "AST", "GLUCOSE" in the last 168 hours.  Invalid input(s): "LABALBU" Lab Results  Component Value Date   CKTOTAL 173 05/15/2007   TROPONINI <0.03 07/19/2014  Lab Results  Component Value Date   CHOL 182 03/22/2007   Lab Results  Component Value Date   HDL 33.7 (L) 03/22/2007   No results found for: "LDLCALC" Lab Results  Component Value Date   TRIG 293 [employer] [facility]) 03/22/2007   Lab Results  Component Value Date   CHOLHDL 5.4 CALC 03/22/2007   Lab Results  Component Value Date   LDLDIRECT 94.1 03/22/2007      Radiology: No results found.  EKG: Afib rate 154 poor R wave progression LAD 09/11/2022 afib rate 101 nonspecific ST changes    ASSESSMENT AND PLAN:  CAD/CABG:  distant with two arterial grafts Appears that he may have had stents prior to CABG. Now that afib stabilized will order lexiscan myovue  HLD:  needs labs not on statin  Addisons:  f/u endocrine continue cortef and florinef Smoking:  counseled on cessation < 10 minutes lung cancer screeing CT continue proventil Edema:  dependant with varicosities and obesity see below AFib: on Eliquis CHADV VASC 3 post [facility] successful 10/12/21 Continue lopressor ERAF with Tikosyn conversion but d/c due to long QT. Amiodarone loaded and successful DCC on 01/03/22 Not clear to me why Dr Ladona Ridgel felt he was a poor ablation candidate    Lung cancer CT Lexiscan myovue  Labs for amiodarone and DOAC CBC, BMET, TSH LFTls  F/U me in  6 months     Signed: Charlton Haws 09/11/2022, 12:32 PM

## 2022-09-19 ENCOUNTER — Ambulatory Visit: Payer: Self-pay | Attending: Cardiovascular Disease | Admitting: Cardiovascular Disease

## 2022-09-21 ENCOUNTER — Encounter: Payer: Self-pay | Admitting: Cardiovascular Disease
